# Patient Record
Sex: Male | Born: 1960
Health system: Southern US, Community
[De-identification: ages and names within clinical notes are randomized; demographics above are authoritative.]

## PROBLEM LIST (undated history)

## (undated) DIAGNOSIS — I10 Essential (primary) hypertension: Secondary | ICD-10-CM

## (undated) DIAGNOSIS — E119 Type 2 diabetes mellitus without complications: Secondary | ICD-10-CM

## (undated) DIAGNOSIS — N529 Male erectile dysfunction, unspecified: Secondary | ICD-10-CM

## (undated) DIAGNOSIS — K219 Gastro-esophageal reflux disease without esophagitis: Secondary | ICD-10-CM

## (undated) DIAGNOSIS — G4730 Sleep apnea, unspecified: Secondary | ICD-10-CM

## (undated) HISTORY — DX: Gastro-esophageal reflux disease without esophagitis: K21.9

## (undated) HISTORY — DX: Sleep apnea, unspecified: G47.30

## (undated) HISTORY — PX: OTHER SURGICAL HISTORY: SHX169

## (undated) HISTORY — DX: Male erectile dysfunction, unspecified: N52.9

## (undated) HISTORY — PX: CERVICAL SPINE SURGERY: SHX589

## (undated) HISTORY — PX: KNEE ARTHROSCOPY: SUR90

## (undated) HISTORY — DX: Type 2 diabetes mellitus without complications: E11.9

---

## 2003-01-25 ENCOUNTER — Ambulatory Visit (HOSPITAL_COMMUNITY): Admission: RE | Admit: 2003-01-25 | Discharge: 2003-01-25 | Payer: Self-pay | Admitting: Family Medicine

## 2003-01-25 ENCOUNTER — Encounter: Payer: Self-pay | Admitting: Family Medicine

## 2003-01-31 ENCOUNTER — Encounter: Payer: Self-pay | Admitting: Family Medicine

## 2003-01-31 ENCOUNTER — Ambulatory Visit (HOSPITAL_COMMUNITY): Admission: RE | Admit: 2003-01-31 | Discharge: 2003-01-31 | Payer: Self-pay | Admitting: Family Medicine

## 2003-03-17 ENCOUNTER — Ambulatory Visit (HOSPITAL_COMMUNITY): Admission: RE | Admit: 2003-03-17 | Discharge: 2003-03-18 | Payer: Self-pay | Admitting: Neurosurgery

## 2005-04-18 ENCOUNTER — Ambulatory Visit: Payer: Self-pay | Admitting: Internal Medicine

## 2005-04-29 ENCOUNTER — Ambulatory Visit: Payer: Self-pay | Admitting: Internal Medicine

## 2005-04-29 ENCOUNTER — Ambulatory Visit (HOSPITAL_COMMUNITY): Admission: RE | Admit: 2005-04-29 | Discharge: 2005-04-29 | Payer: Self-pay | Admitting: Internal Medicine

## 2005-04-30 ENCOUNTER — Ambulatory Visit: Admission: RE | Admit: 2005-04-30 | Discharge: 2005-04-30 | Payer: Self-pay | Admitting: Family Medicine

## 2005-05-10 ENCOUNTER — Ambulatory Visit: Payer: Self-pay | Admitting: Pulmonary Disease

## 2006-05-14 ENCOUNTER — Ambulatory Visit (HOSPITAL_COMMUNITY): Admission: RE | Admit: 2006-05-14 | Discharge: 2006-05-14 | Payer: Self-pay | Admitting: Family Medicine

## 2007-03-05 ENCOUNTER — Ambulatory Visit (HOSPITAL_COMMUNITY): Admission: RE | Admit: 2007-03-05 | Discharge: 2007-03-05 | Payer: Self-pay | Admitting: Neurosurgery

## 2007-03-16 ENCOUNTER — Observation Stay (HOSPITAL_COMMUNITY): Admission: RE | Admit: 2007-03-16 | Discharge: 2007-03-17 | Payer: Self-pay | Admitting: Neurosurgery

## 2010-06-03 ENCOUNTER — Encounter: Payer: Self-pay | Admitting: Family Medicine

## 2010-09-28 NOTE — Procedures (Signed)
[name], Chad [name]                 ACCOUNT NO.:  [id number]   MEDICAL RECORD NO.:  [id number]          PATIENT TYPE:  OUT   LOCATION:  SLEEP LAB                     FACILITY:  [facility]   PHYSICIAN:  Marcelyn Bruins, M.D. [facility] DATE OF BIRTH:  May 21, 1960   DATE OF STUDY:  04/30/2005                              NOCTURNAL POLYSOMNOGRAM   REFERRING PHYSICIAN:  Dr. Lilyan Punt.   DATE OF STUDY:  April 30, 2005.   INDICATION FOR STUDY:  Hypersomnia with sleep apnea.   EPWORTH SCORE:  17.   SLEEP ARCHITECTURE:  The patient total sleep time of 402 minutes with large  amounts of slow wave sleep and adequate REM. Sleep onset latency was normal  at 17 minutes and REM onset was mildly prolonged. Sleep efficiency was 93%.   RESPIRATORY DATA:  The patient underwent split night protocol where he was  found to have 173 obstructive events in the first 113 minutes of sleep. This  gave him a respiratory disturbance index of 92 events per hour extrapolated  over the entire study. There is very loud snoring noted, however, the events  were not positional. By protocol, the patient was placed on a medium  Respironics comfort select C-PAP mask and ultimately titrated to a final  pressure 11 cm with good control of his obstructive events.   OXYGEN DATA:  The patient had O2 desaturation as low as 80% associated with  his obstructive events.   CARDIAC DATA:  No clinically significant cardiac arrhythmias.   MOVEMENT/PARASOMNIA:  The patient had small numbers of leg jerks with no  significant sleep disruption.   IMPRESSION/RECOMMENDATIONS:  Very severe obstructive sleep apnea with a  respiratory disturbance index of 92 events per hour and O2 desaturation as  low as 80%. The patient was placed on C-PAP with a medium  Respironics comfort select nasal mask and titrated to a final pressure of 11  cm, with excellent control of his obstructive events.     ______________________________  Marcelyn Bruins, M.D. [facility]  Diplomate, American Board of Sleep  Medicine     KC/MEDQ  D:  05/10/2005 13:04:19  T:  05/11/2005 62:95:28  Job:  [id number]

## 2010-09-28 NOTE — Op Note (Signed)
NAMECHADLEY, Chad [name] NO.:  [id number]   MEDICAL RECORD NO.:  [id number]          PATIENT TYPE:  OBV   LOCATION:  3029                         FACILITY:  [facility]   PHYSICIAN:  Cristi Loron, M.D.DATE OF BIRTH:  08-06-1960   DATE OF PROCEDURE:  DATE OF DISCHARGE:  03/17/2007                               OPERATIVE REPORT   BRIEF HISTORY:  The patient is a 50 year old white male on whom I  performed a C5-C6 anterior cervical discectomy and fusion plating years  ago.  He did well until recently when he developed severe neck and right  arm pain consistent with a C7 radiculopathy.  He failed medical  management, was worked up with a cervical MRI, which demonstrated a  herniated disk at C6-C7 on the right.  I discussed various treatment  options with the patient including surgery.  The patient has weighed the  risks, benefits, and alternatives of surgery and decided to proceed with  a C6-C7 anterior cervical discectomy fusion and plating, as well as  removal of the old plate at E4-V4.   PREOPERATIVE DIAGNOSES:  C6-C7 herniated nucleus pulposus, spinal  stenosis, cervical radiculopathy, and cervicalgia.   POSTOPERATIVE DIAGNOSES:  C6-C7 herniated nucleus pulposus, spinal  stenosis, cervical radiculopathy, and cervicalgia.   PROCEDURE:  C6-C7 extensive anterior cervical discectomy/decompression;  C6-C7 anterior interbody local autograft arthrodesis; insertion of C6-C7  interbody prosthesis (Alphatec PEEK interbody prosthesis); C6, C7, C8  cervical plating using Slim-Loc titanium plate and screws; removal of  old Synthes plate at U9-W1.   SURGEON:  Cristi Loron, M.D.   ASSISTANT:  Coletta Memos, M.D.   ANESTHESIA:  General endotracheal.   ESTIMATED BLOOD LOSS:  100 mL.   SPECIMENS:  None.   DRAINS:  None.   COMPLICATIONS:  None.   DESCRIPTION OF PROCEDURE:  The patient was brought to the operating by  the anesthesia team.  General endotracheal  anesthesia was induced.  The  patient remained in the supine position.  A roll was placed under his  shoulders to place the neck in slight extension.  His anterior cervical  region was then shaved with clippers and prepared with Betadine scrub  and Betadine solution.  Sterile drapes were applied.  I then injected  the area to be incised with Marcaine with epinephrine solution and used  a scalpel to incise the patient's previous surgical scar on the left.  I  used the Metzenbaum scissors to divide the platysma muscle and then  dissected medial to sternocleidomastoid muscle, jugular vein, and  carotid artery.  We encountered expected of scar tissue from the prior  operation.  We carefully identified the esophagus and retracted it  medially and then used Kitner swabs to clear soft tissue from the  anterior cervical spine.  This exposed the plate at X9-J4.  We used the  scalpel and nerve hooks to clear soft tissue overlying the anterior  cervical plate at N8-G9 and then we removed the interlocking screw from  the old Synthes plate and then the screws and the plate.   We then used the electrocautery  to detach the medial border of the  longus colli muscle bilaterally at C6-C7.  We then inserted the Caspar  self-retaining retractor underneath his longus colli muscle bilaterally  to prior exposure.  We did not shoot an intraoperative radiograph, as we  obviously knew which level we were at because of the old plate screws at  C5-C6.  I incised the C6-C7 intervertebral disc and performed a partial  intervertebral discectomy with the pituitary forceps and the Carlen  curettes.  I then inserted the distraction screws at C6 and C7 to  distract the interspace and then used a high-speed drill to decorticate  the vertebral endplates and drilled away the remainder C6-C7  intervertebral disc to drill away some posterior spondylosis and to thin  out the posterior longitudinal ligament.  I then incised the  ligament  with arachnoid knife and removed it with the Kerrison punch undercutting  the vertebral endplates decompressing the thecal sac.  I then performed  foraminotomies about the bilateral C7 nerve root.  Of note, we  encountered expected large disc herniation C6-C7 on the right  compressing the right C7 nerve root.  At this point, we had a good  decompression.  We now turned attention to arthrodesis.   We used the trials spacers and determined to use a 7-mm medium Alphatec  interbody prosthesis.  We prefilled this prosthesis with combination of  local autograft bone we obtained during the decompression, as well as  VITOSS bone-graft extender.  We then inserted the prosthesis, distracted  C6-C7 interspace and removed the distraction screws.  There was a good  snug fit of the prosthesis in the interspace.  We then filled lateral to  the prosthesis with local autograft bone and VITOSS completing the  arthrodesis.   We now turned attention to anterior spinal instrumentation.  We used the  high-speed drill to remove some ventral spondylosis from the vertebral  endplates at C6-C7 so that the plate would lay down flat.  We selected  appropriate length Codman Slim-Loc anterior cervical plate and laid it  along the anterior aspect of vertebral bodies at C6 and C7.  We used the  old holes at C6 and secured the plate with 16-XW self-tapping screws.  We drilled two new 14-mm holes at C7 and then secured the plate at C7 by  placing two 14-mm self-tapping screws there as well.  We then obtained  intraoperative radiograph.  There was limited visualization of the  instrumentation because of the patient's body habitus but looked good in  vivo.  We therefore secured the screws and plate by locking each cam.   We then obtained hemostasis using bipolar cautery.  We irrigated the  wound out with bacitracin solution and removed the retractor.  We then  inspected the esophagus for any damage; there was  none apparent.  We  then reapproximated the patient's platysma muscle with interrupted 3-0  Vicryl suture, subcutaneous with interrupted 3-0 Vicryl suture, and the  skin with Steri-Strips and Benzoin.  The wound was then coated with  bacitracin ointment.  A sterile dressing was applied.  The drapes were  removed, and the patient was subsequently extubated by the anesthesia  team and transported to the post-anesthesia care unit in stable  condition.  All sponge, instrument, and needle counts were correct at  the end of this case.      Cristi Loron, M.D.  Electronically Signed     JDJ/MEDQ  D:  03/18/2007  T:  03/19/2007  Job:  [id number]

## 2010-09-28 NOTE — Op Note (Signed)
NAMEWENDLE, Chad [name]                           ACCOUNT NO.:  [id number]   MEDICAL RECORD NO.:  [id number]                   PATIENT TYPE:  OIB   LOCATION:  3001                                 FACILITY:  [facility]   PHYSICIAN:  Cristi Loron, M.D.            DATE OF BIRTH:  05/05/1961   DATE OF PROCEDURE:  03/17/2003  DATE OF DISCHARGE:  03/18/2003                                 OPERATIVE REPORT   INDICATIONS FOR PROCEDURE:  The patient is a 50 year old white male who has  suffered from neck  and left arm pain. He failed medical management  and was  worked up with a cervical MRI which demonstrated a herniated disk and  spondylosis and stenosis at C5-6. I discussed  the various treatment options  with the patient. The patient weighed the risks and benefits and  alternatives to surgery and decided to proceed with a C5-6 anterior cervical  diskectomy, fusion and plating.   PREOPERATIVE DIAGNOSIS:  C5-6 herniated nucleus pulposus, stenosis,  spondylosis, cervical radiculopathy, cervicalgia.   POSTOPERATIVE DIAGNOSIS:  C5-6 herniated nucleus pulposus, stenosis,  spondylosis, cervical radiculopathy, cervicalgia.   PROCEDURE:  C5-6 expansive anterior cervical diskectomy, decompression;  interbody iliac crest allograft arthrodesis; anterior cervical plating with  Synthes titanium plate and screws.   SURGEON:  Cristi Loron, M.D.   ASSISTANT:  Stefani Dama, M.D.   ANESTHESIA:  General endotracheal anesthesia.   ESTIMATED BLOOD LOSS:  50 mL.   SPECIMENS:  None.   DRAINS:  None.   COMPLICATIONS:  None.   DESCRIPTION OF PROCEDURE:  The patient was brought to the operating room by  the anesthesia team. General endotracheal anesthesia was induced. The  patient remained in the supine position. A roll was placed under the  shoulders to place his neck  in slight extension. His anterior cervical  region was then prepared with Betadine scrub and Betadine solution. Sterile  drapes were applied. I then injected the area to be incised with Marcaine  with epinephrine solution.   I used a scalpel to make a transverse incision in the patient's left  anterior neck. I used the Metzenbaum scissors to divide the platysma muscle  and then to dissect medial to the sternocleidomastoid, jugular vein and  carotid artery. I bluntly dissected down towards the anterior cervical  spine. I carefully  identified the esophagus  and retracted  it medially. I  used the Kidner swabs to clear soft tissue from the anterior cervical spine.  I then inserted a bent spinal needle into neck  to expose to interspace. We  then obtained the intraoperative radiograph to confirm our location.   We then used electrocautery to detach the medial border of the longus coli  muscle bilaterally  from the C5-6 interspace. We inserted the McCullough  retractor for exposure and  then incised the C5-6 intravertebral disk with a  #15 blade scalpel. We performed a  partial diskectomy using the pituitary  forceps and the Carlen curets. We inserted distractor screws at C5 and C6 to  distract the interspace, and used the high-speed drill to decorticate the  vertebral endplates of C5-6 as well as to drill away the remainder of the C5-  6 intravertebral disk, thin out the posterior longitudinal ligament and  drill away some posterior  spondylosis.   We then incised the thinned out ligament with the arachnoid knife and then  removed it with the Kerrison punch, undercutting the vertebral endplates,  decompressing the  thecal sac. We then performed a generous foraminotomy  about the bilateral C6 nerve roots decompressing them. We encountered a  combination of ruptured disk as well as bone spur, compressing the left  greater than right C6 nerve roots.   Having completed the decompression, we now turned our attention to the  arthrodesis. We obtained iliac crest tricortical allograft bone graft and  fashioned it to  its approximate dimensions, 7 mm in height and 1 cm in  depth. We inserted the bone graft in the distracted C5-6 interspace. We then  removed the distraction screws. There was a good structure of the bone  graft.   We now turned our attention to the instrumentation. We used the high-speed  drill to drill away some ventral spondylosis so that the plate would lay  down flat. We selected the appropriate sized Synthes anterior cervical  plate. We laid it along the anterior aspect of the vertebral bodies from C5  to C6. We drilled 2 holes at C5 and 2 at C6, tapped the holes  and secured  the  plate to the vertebral body, placing two 14-mm screws at C5 and 6. We  then obtained an intraoperative radiograph which demonstrated good position  of the plate, screws and my graft. We secured the screws to the plate by  placing a locking screw at each screw.   We then obtained stringent hemostasis using bipolar electrocautery. We  copiously irrigated the wound out with Bacitracin solution. We removed the  solution. We then removed  the Caspar self-retaining retractor. We inspected  the esophagus  for any damage; there  was none apparent. We then  reapproximated the patient's platysma muscle with interrupted 3-0 Vicryl  suture, the subcutaneous tissue with interrupted 3-0 Vicryl suture and the  skin with Steri-Strips and Benzoin. The wound was then coated with  Bacitracin ointment. A sterile dressing was applied.   The drapes were removed. The patient was subsequently extubated by the  anesthesia team and transported to the post anesthesia care unit in stable  condition. All sponge, instrument and needle counts were correct at the end  of the case.                                               Cristi Loron, M.D.    JDJ/MEDQ  D:  03/17/2003  T:  03/18/2003  Job:  [id number]

## 2010-09-28 NOTE — Op Note (Signed)
Chad [name], ABAIR                 ACCOUNT NO.:  [id number]   MEDICAL RECORD NO.:  [id number]          PATIENT TYPE:  AMB   LOCATION:  DAY                           FACILITY:  [facility]   PHYSICIAN:  Lionel December, M.D.    DATE OF BIRTH:  March 21, 1961   DATE OF PROCEDURE:  04/29/2005  DATE OF DISCHARGE:                                 OPERATIVE REPORT   PROCEDURE:  Colonoscopy.   INDICATIONS:  Derrius is a 50 year old Caucasian male who developed diarrhea  and abdominal cramps about two weeks ago. He has improved, but his bowels  have never returned to normal. He was noted to have heme-positive stools by  Dr. Gerda Diss. I saw him two weeks ago and begun on dicyclomine, and he does  not feel any better. He is undergoing colonoscopy to make sure he does not  have low grade colitis. Procedure and risks were reviewed with the patient,  and informed consent was obtained.   MEDICINE FOR CONSCIOUS SEDATION:  Demerol 50 mg IV, Versed 5 mg IV.   FINDINGS:  Procedure performed in endoscopy suite. The patient's vital signs  and O2 saturation were monitored during the procedure and remained stable.  The patient was placed in left lateral position and rectal examination  performed. No abnormality noted on external or digital exam. Olympus  videoscope was placed in rectum and advanced under vision into sigmoid colon  and beyond. Preparation was satisfactory. There were a few diverticula at  proximal sigmoid colon. There was small polyp at proximal and a small polyp  at transverse colon which was ablated via cold biopsy. Scope was passed into  cecum which was identified by appendiceal orifice and ileocecal valve.  Pictures taken for the record. As the scope was withdrawn, colonic mucosa  was carefully examined and was normal throughout. Rectal mucosa similarly  was normal. Scope was retroflexed to examine anorectal junction which was  unremarkable. Endoscope was straightened and withdrawn. The patient  tolerated the procedure well.   FINAL DIAGNOSIS:  1.  No endoscopic evidence of colitis.  2.  Few small diverticula at sigmoid colon.  3.  A 3-mm polyp ablated via cold biopsy from transverse colon.   RECOMMENDATIONS:  Suspect he has post infectious diarrhea or residual  symptoms. I feel his bowel should return to normal in the future.   RECOMMENDATIONS:  I will be contacting the patient with biopsy results. I  would like for him to continue dicyclomine, but he can try 10 mg 3 times a  day.      Lionel December, M.D.  Electronically Signed     NR/MEDQ  D:  04/29/2005  T:  04/30/2005  Job:  [id number]   cc:   Lorin Picket A. Gerda Diss, MD  Fax: [phone]

## 2011-02-19 LAB — CBC
HCT: 41
Hemoglobin: 14.2
MCHC: 34.7
MCV: 89.3
Platelets: 213
RBC: 4.59
RDW: 13.2
WBC: 10.2

## 2013-06-30 ENCOUNTER — Telehealth: Payer: Self-pay | Admitting: *Deleted

## 2013-07-02 NOTE — Telephone Encounter (Signed)
Order faxed to [facility]  

## 2013-08-05 ENCOUNTER — Encounter: Payer: Self-pay | Admitting: Family Medicine

## 2013-08-05 ENCOUNTER — Ambulatory Visit (INDEPENDENT_AMBULATORY_CARE_PROVIDER_SITE_OTHER): Payer: 59 | Admitting: Family Medicine

## 2013-08-05 VITALS — BP 120/78 | HR 78 | Temp 98.0°F | Resp 18 | Ht 70.0 in | Wt 253.0 lb

## 2013-08-05 DIAGNOSIS — Z0000 Encounter for general adult medical examination without abnormal findings: Secondary | ICD-10-CM

## 2013-08-05 DIAGNOSIS — G4733 Obstructive sleep apnea (adult) (pediatric): Secondary | ICD-10-CM

## 2013-08-05 DIAGNOSIS — G4730 Sleep apnea, unspecified: Secondary | ICD-10-CM | POA: Insufficient documentation

## 2013-08-05 MED ORDER — SILDENAFIL CITRATE 100 MG PO TABS: 50.0000 mg | ORAL_TABLET | Freq: Every day | ORAL | Status: DC | PRN

## 2013-08-05 NOTE — Progress Notes (Signed)
   Subjective:    Patient ID: Chad [name], male    DOB: 18-Oct-1960, 53 y.o.   MRN: [id number]  HPI  Patient has a history of obstructive sleep apnea. This was diagnosed in 2006. At that time he had an Epworth sleep score of 17. The sleep study revealed very severe obstructive sleep apnea with a respiratory disturbance index of 92 events per hour. He was titrated to 11 cm of water pressure using CPAP. Prior to using CPAP, the patient reported hypersomnolence, excessive daytime sleepiness, fatigability. Since wearing the CPAP he has no issues with any of these symptoms. He feels great wearing his CPAP. He has been using it consistently for the last 9 years. He is requesting a new machine as his old machine is starting to give him mechanical problems.  He wears the machine approximatly 7 hours every night and is compliant with therapy.  He is also due for a colonoscopy. He has a history of colon polyps. Past Medical History  Diagnosis Date  . GERD (gastroesophageal reflux disease)   . Erectile dysfunction   . Sleep apnea    No current outpatient prescriptions on file prior to visit.   No current facility-administered medications on file prior to visit.   No Known Allergies History   Social History  . Marital Status: Married    Spouse Name: N/A    Number of Children: N/A  . Years of Education: N/A   Occupational History  . Not on file.   Social History Main Topics  . Smoking status: Never Smoker   . Smokeless tobacco: Not on file  . Alcohol Use: Yes  . Drug Use: No  . Sexual Activity: Not on file   Other Topics Concern  . Not on file   Social History Narrative  . No narrative on file     Review of Systems  All other systems reviewed and are negative.       Objective:   Physical Exam  Vitals reviewed. Neck: Neck supple. No JVD present. No thyromegaly present.  Cardiovascular: Normal rate, regular rhythm and normal heart sounds.   No murmur heard. Pulmonary/Chest:  Effort normal and breath sounds normal. No respiratory distress. He has no wheezes. He has no rales.  Abdominal: Soft. Bowel sounds are normal. He exhibits no distension. There is no tenderness. There is no rebound and no guarding.  Musculoskeletal: He exhibits no edema.  Lymphadenopathy:    He has no cervical adenopathy.          Assessment & Plan:  1. Obstructive sleep apnea I am ordering a new CPAP machine and our fax that information along to his office visit over to [facility] pharmacy [facility].  2. Routine general medical examination at a health care facility Recommended the patient return fasting for a CBC, CMP, fasting lipid panel, and a PSA. I recommended an annual physical exam once a year. Also schedule the patient to see his gastroenterologist for a colonoscopy - CBC with Differential; Future - COMPLETE METABOLIC PANEL WITH GFR; Future - Lipid panel; Future - PSA; Future

## 2013-08-09 ENCOUNTER — Encounter (INDEPENDENT_AMBULATORY_CARE_PROVIDER_SITE_OTHER): Payer: Self-pay | Admitting: *Deleted

## 2013-08-11 ENCOUNTER — Other Ambulatory Visit: Payer: 59

## 2013-08-11 LAB — LIPID PANEL
Cholesterol: 133 mg/dL (ref 0–200)
HDL: 35 mg/dL — ABNORMAL LOW (ref >39)
LDL Cholesterol: 70 mg/dL (ref 0–99)
Total CHOL/HDL Ratio: 3.8 Ratio
Triglycerides: 141 mg/dL (ref <150)
VLDL: 28 mg/dL (ref 0–40)

## 2013-08-11 LAB — COMPLETE METABOLIC PANEL WITH GFR
ALT: 23 U/L (ref 0–53)
AST: 23 U/L (ref 0–37)
Albumin: 4.2 g/dL (ref 3.5–5.2)
Alkaline Phosphatase: 56 U/L (ref 39–117)
BUN: 12 mg/dL (ref 6–23)
CO2: 27 mEq/L (ref 19–32)
Calcium: 9 mg/dL (ref 8.4–10.5)
Chloride: 106 mEq/L (ref 96–112)
Creat: 0.96 mg/dL (ref 0.50–1.35)
GFR, Est African American: >89 mL/min
GFR, Est Non African American: >89 mL/min
Glucose, Bld: 85 mg/dL (ref 70–99)
Potassium: 4.3 mEq/L (ref 3.5–5.3)
Sodium: 141 mEq/L (ref 135–145)
Total Bilirubin: 0.6 mg/dL (ref 0.2–1.2)
Total Protein: 6.7 g/dL (ref 6.0–8.3)

## 2013-08-11 LAB — CBC WITH DIFFERENTIAL/PLATELET
Basophils Absolute: 0.1 10*3/uL (ref 0.0–0.1)
Basophils Relative: 1 % (ref 0–1)
Eosinophils Absolute: 0.1 10*3/uL (ref 0.0–0.7)
Eosinophils Relative: 2 % (ref 0–5)
HCT: 41.7 % (ref 39.0–52.0)
Hemoglobin: 14.1 g/dL (ref 13.0–17.0)
Lymphocytes Relative: 29 % (ref 12–46)
Lymphs Abs: 2 10*3/uL (ref 0.7–4.0)
MCH: 30.8 pg (ref 26.0–34.0)
MCHC: 33.8 g/dL (ref 30.0–36.0)
MCV: 91 fL (ref 78.0–100.0)
Monocytes Absolute: 0.5 10*3/uL (ref 0.1–1.0)
Monocytes Relative: 7 % (ref 3–12)
Neutro Abs: 4.3 10*3/uL (ref 1.7–7.7)
Neutrophils Relative %: 61 % (ref 43–77)
Platelets: 194 10*3/uL (ref 150–400)
RBC: 4.58 MIL/uL (ref 4.22–5.81)
RDW: 13.6 % (ref 11.5–15.5)
WBC: 7 10*3/uL (ref 4.0–10.5)

## 2013-08-11 NOTE — Addendum Note (Signed)
Addended by: Reginia FortsATKINS, Daivon Rayos on: 08/11/2013 08:06 AM   Modules accepted: Orders

## 2013-08-12 ENCOUNTER — Encounter: Payer: Self-pay | Admitting: *Deleted

## 2013-08-12 LAB — PSA: PSA: 0.91 ng/mL (ref <=4.00)

## 2013-08-18 ENCOUNTER — Telehealth (INDEPENDENT_AMBULATORY_CARE_PROVIDER_SITE_OTHER): Payer: Self-pay | Admitting: *Deleted

## 2013-08-18 ENCOUNTER — Other Ambulatory Visit (INDEPENDENT_AMBULATORY_CARE_PROVIDER_SITE_OTHER): Payer: Self-pay | Admitting: *Deleted

## 2013-08-18 ENCOUNTER — Encounter (INDEPENDENT_AMBULATORY_CARE_PROVIDER_SITE_OTHER): Payer: Self-pay | Admitting: *Deleted

## 2013-08-18 DIAGNOSIS — Z1211 Encounter for screening for malignant neoplasm of colon: Secondary | ICD-10-CM

## 2013-08-18 NOTE — Telephone Encounter (Signed)
Patient needs movi prep 

## 2013-08-19 MED ORDER — PEG-KCL-NACL-NASULF-NA ASC-C 100 G PO SOLR: 1.0000 | Freq: Once | ORAL | Status: DC

## 2013-09-27 ENCOUNTER — Encounter: Payer: Self-pay | Admitting: Family Medicine

## 2013-10-29 ENCOUNTER — Telehealth (INDEPENDENT_AMBULATORY_CARE_PROVIDER_SITE_OTHER): Payer: Self-pay | Admitting: *Deleted

## 2013-10-29 NOTE — Telephone Encounter (Signed)
  Procedure: tcs  Reason/Indication:  screening  Has patient had this procedure before?  Yes, 10 years ago  If so, when, by whom and where?    Is there a family history of colon cancer?  no  Who?  What age when diagnosed?    Is patient diabetic?   no      Does patient have prosthetic heart valve?  no  Do you have a pacemaker?  no  Has patient ever had endocarditis? no  Has patient had joint replacement within last 12 months?  no  Does patient tend to be constipated or take laxatives? no  Is patient on Coumadin, Plavix and/or Aspirin? no  Medications: prilosec 20 mg daily  Allergies: nkda  Medication Adjustment:   Procedure date & time: 11/25/13 at 730

## 2013-11-01 NOTE — Telephone Encounter (Signed)
agree

## 2013-11-18 ENCOUNTER — Encounter (HOSPITAL_COMMUNITY): Payer: Self-pay | Admitting: Pharmacy Technician

## 2013-12-09 ENCOUNTER — Other Ambulatory Visit (INDEPENDENT_AMBULATORY_CARE_PROVIDER_SITE_OTHER): Payer: Self-pay | Admitting: *Deleted

## 2013-12-09 ENCOUNTER — Encounter (INDEPENDENT_AMBULATORY_CARE_PROVIDER_SITE_OTHER): Payer: Self-pay | Admitting: *Deleted

## 2013-12-09 DIAGNOSIS — Z1211 Encounter for screening for malignant neoplasm of colon: Secondary | ICD-10-CM

## 2014-01-10 ENCOUNTER — Telehealth (INDEPENDENT_AMBULATORY_CARE_PROVIDER_SITE_OTHER): Payer: Self-pay | Admitting: *Deleted

## 2014-01-10 NOTE — Telephone Encounter (Signed)
  Procedure: tcs  Reason/Indication:  screening  Has patient had this procedure before?  Yes, 10 years ago  If so, when, by whom and where?    Is there a family history of colon cancer?  no  Who?  What age when diagnosed?    Is patient diabetic?   no      Does patient have prosthetic heart valve?  no  Do you have a pacemaker?  no  Has patient ever had endocarditis? no  Has patient had joint replacement within last 12 months?  no  Does patient tend to be constipated or take laxatives? no  Is patient on Coumadin, Plavix and/or Aspirin? no  Medications: prilosec 20 mg daily,  Allergies: nkda  Medication Adjustment:   Procedure date & time: 02/09/14 at 12

## 2014-01-10 NOTE — Telephone Encounter (Signed)
agree

## 2014-02-09 ENCOUNTER — Ambulatory Visit (HOSPITAL_COMMUNITY)
Admission: RE | Admit: 2014-02-09 | Discharge: 2014-02-09 | Disposition: A | Discharge status: Home Health | Payer: 59 | Source: Ambulatory Visit | Attending: Internal Medicine | Admitting: Internal Medicine

## 2014-02-09 ENCOUNTER — Encounter (HOSPITAL_COMMUNITY)
Admission: RE | Disposition: A | Discharge status: Home Health | Payer: Self-pay | Source: Ambulatory Visit | Attending: Internal Medicine

## 2014-02-09 ENCOUNTER — Encounter (HOSPITAL_COMMUNITY): Payer: Self-pay | Admitting: *Deleted

## 2014-02-09 DIAGNOSIS — Z79899 Other long term (current) drug therapy: Secondary | ICD-10-CM | POA: Insufficient documentation

## 2014-02-09 DIAGNOSIS — K5289 Other specified noninfective gastroenteritis and colitis: Secondary | ICD-10-CM | POA: Insufficient documentation

## 2014-02-09 DIAGNOSIS — K648 Other hemorrhoids: Secondary | ICD-10-CM | POA: Insufficient documentation

## 2014-02-09 DIAGNOSIS — K633 Ulcer of intestine: Secondary | ICD-10-CM

## 2014-02-09 DIAGNOSIS — Z1211 Encounter for screening for malignant neoplasm of colon: Secondary | ICD-10-CM

## 2014-02-09 DIAGNOSIS — K644 Residual hemorrhoidal skin tags: Secondary | ICD-10-CM | POA: Insufficient documentation

## 2014-02-09 DIAGNOSIS — G4730 Sleep apnea, unspecified: Secondary | ICD-10-CM | POA: Insufficient documentation

## 2014-02-09 DIAGNOSIS — K5730 Diverticulosis of large intestine without perforation or abscess without bleeding: Secondary | ICD-10-CM

## 2014-02-09 DIAGNOSIS — K219 Gastro-esophageal reflux disease without esophagitis: Secondary | ICD-10-CM | POA: Insufficient documentation

## 2014-02-09 DIAGNOSIS — N529 Male erectile dysfunction, unspecified: Secondary | ICD-10-CM | POA: Insufficient documentation

## 2014-02-09 HISTORY — PX: COLONOSCOPY: SHX5424

## 2014-02-09 SURGERY — COLONOSCOPY
Anesthesia: Moderate Sedation

## 2014-02-09 MED ORDER — MEPERIDINE HCL 50 MG/ML IJ SOLN
INTRAMUSCULAR | Status: DC | PRN
  Administered 2014-02-09 (×2): 25 mg via INTRAVENOUS

## 2014-02-09 MED ORDER — MEPERIDINE HCL 50 MG/ML IJ SOLN
INTRAMUSCULAR | Status: AC
  Filled 2014-02-09: qty 1

## 2014-02-09 MED ORDER — MIDAZOLAM HCL 5 MG/5ML IJ SOLN
INTRAMUSCULAR | Status: DC | PRN
  Administered 2014-02-09 (×3): 2 mg via INTRAVENOUS

## 2014-02-09 MED ORDER — SODIUM CHLORIDE 0.9 % IV SOLN
INTRAVENOUS | Status: DC
  Administered 2014-02-09: 09:00:00 via INTRAVENOUS

## 2014-02-09 MED ORDER — MIDAZOLAM HCL 5 MG/5ML IJ SOLN
INTRAMUSCULAR | Status: AC
  Filled 2014-02-09: qty 10

## 2014-02-09 NOTE — Op Note (Signed)
[facility] [address] [place], [id number]   COLONOSCOPY PROCEDURE REPORT     EXAM DATE: 02/09/2014  PATIENT NAME:      Chad [name], [name]           MR #:      [id number]  BIRTHDATE:       25-Jun-1960      VISIT #:     [id number]  ATTENDING:     Lionel DecemberNajeeb Keayra Graham, MD     STATUS:     outpatient REFERRING MD:      Lynnea FerrierWarren Pickard, M.D. ASA CLASS:        Class I  INDICATIONS:  The patient is a 53 yr old male here for a colonoscopy due to average risk for colon cancer. PROCEDURE PERFORMED:     Colonoscopy, screening MEDICATIONS:     Demerol 50 mg IV and Versed 6 mg IV ESTIMATED BLOOD LOSS:     None  CONSENT: The patient understands the risks and benefits of the procedure and understands that these risks include, but are not limited to: sedation, allergic reaction, infection, perforation and/or bleeding. Alternative means of evaluation and treatment include, among others: physical exam, x-rays, and/or surgical intervention. The patient elects to proceed with this endoscopic procedure.  DESCRIPTION OF PROCEDURE: During intra-op preparation period all mechanical & medical equipment was checked for proper function. Hand hygiene and appropriate measures for infection prevention was taken. After the risks, benefits and alternatives of the procedure were thoroughly explained, Informed consent was verified, confirmed and timeout was successfully executed by the treatment team. A digital exam revealed no abnormalities of the rectum.      The [id number] ([id number] endoscope was introduced through the anus and advanced to the cecum, which was identified by both the appendix and ileocecal valve. The prep was excellent.. The instrument was then slowly withdrawn as the colon was fully examined.   COLON FINDINGS: Few diverticula at sigmoid colon.   Small ulcer noted at the distal sigmoid colon.  Biopsy taken for routine histology.   Small external and internal hemorrhoids  were found. Retroflexed views revealed no abnormalities.  The scope was then completely withdrawn from the patient and the procedure terminated.  WITHDRAWAL TIME: 17 minutes 0 seconds    ADVERSE EVENTS:      There were no immediate complications.  IMPRESSIONS:     1.  Few diverticula at sigmoid colon 2.  Small ulcer noted at the distal sigmoid colon.  Biopsy taken for routine histology 3.  Small external and internal hemorrhoids  RECOMMENDATIONS:     Await biopsy results RECALL:     Return in 10 years for Colonoscopy.  Lionel DecemberNajeeb Mamadou Breon, MD eSigned:  Lionel DecemberNajeeb Betzabe Bevans, MD 02/09/2014 9:56 AM   cc:  CPT CODES: ICD CODES:  The ICD and CPT codes recommended by this software are interpretations from the data that the clinical staff has captured with the software.  The verification of the translation of this report to the ICD and CPT codes and modifiers is the sole responsibility of the health care institution and practicing physician where this report was generated.  [employer] will not be held responsible for the validity of the ICD and CPT codes included on this report.  AMA assumes no liability for data contained or not contained herein. CPT is a Publishing rights managerregistered trademark of the [employer].

## 2014-02-09 NOTE — H&P (Signed)
Chad [name] is an 53 y.o. male.   Chief Complaint: Patient is here for colonoscopy. HPI: Patient is 53 year old Caucasian male who is in for screening colonoscopy. He denies abdominal pain change in bowel habits or rectal bleeding. His last colonoscopy was diagnostic colonoscopy 9 years ago and had small polyp removed with possible hyperplastic and was advised to return in 10 years.  Past Medical History  Diagnosis Date  . GERD (gastroesophageal reflux disease)   . Erectile dysfunction   . Sleep apnea     Past Surgical History  Procedure Laterality Date  . Left knee arthroscopy    . Cervical spine surgery  2002, 2004    Family History  Problem Relation Age of Onset  . Colon cancer Neg Hx    Social History:  reports that he has never smoked. He does not have any smokeless tobacco history on file. He reports that he drinks alcohol. He reports that he does not use illicit drugs.  Allergies: No Known Allergies  Medications Prior to Admission  Medication Sig Dispense Refill  . peg 3350 powder (MOVIPREP) 100 G SOLR Take 1 kit (200 g total) by mouth once.  1 kit  0  . omeprazole (PRILOSEC) 20 MG capsule Take 20 mg by mouth daily.      . sildenafil (VIAGRA) 100 MG tablet Take 0.5-1 tablets (50-100 mg total) by mouth daily as needed for erectile dysfunction.  5 tablet  11    No results found for this or any previous visit (from the past 48 hour(s)). No results found.  ROS  Blood pressure 126/87, pulse 59, temperature 98.5 F (36.9 C), temperature source Oral, resp. rate 18, height $RemoveBe'5\' 10"'dpoolBtdr$  (1.778 m), weight 240 lb (108.863 kg), SpO2 96.00%. Physical Exam  Constitutional: He appears well-developed and well-nourished.  HENT:  Mouth/Throat: Oropharynx is clear and moist.  Eyes: Conjunctivae are normal. No scleral icterus.  Neck: No thyromegaly present.  Cardiovascular: Normal rate, regular rhythm and normal heart sounds.   No murmur heard. Respiratory: Effort normal and breath  sounds normal.  GI: Soft. He exhibits no distension and no mass. There is no tenderness.  Musculoskeletal: He exhibits no edema.  Lymphadenopathy:    He has no cervical adenopathy.  Neurological: He is alert.  Skin: Skin is warm and dry.     Assessment/Plan Average risk screening colonoscopy.  REHMAN,NAJEEB U 02/09/2014, 9:11 AM

## 2014-02-09 NOTE — Discharge Instructions (Signed)
Resume usual medications and high fiber diet. No driving for 24 hours. Physician will call with biopsy results  Colonoscopy, Care After Refer to this sheet in the next few weeks. These instructions provide you with information on caring for yourself after your procedure. Your health care provider may also give you more specific instructions. Your treatment has been planned according to current medical practices, but problems sometimes occur. Call your health care provider if you have any problems or questions after your procedure. WHAT TO EXPECT AFTER THE PROCEDURE  After your procedure, it is typical to have the following:  A small amount of blood in your stool.  Moderate amounts of gas and mild abdominal cramping or bloating. HOME CARE INSTRUCTIONS  Do not drive, operate machinery, or sign important documents for 24 hours.  You may shower and resume your regular physical activities, but move at a slower pace for the first 24 hours.  Take frequent rest periods for the first 24 hours.  Walk around or put a warm pack on your abdomen to help reduce abdominal cramping and bloating.  Drink enough fluids to keep your urine clear or pale yellow.  You may resume your normal diet as instructed by your health care provider. Avoid heavy or fried foods that are hard to digest.  Avoid drinking alcohol for 24 hours or as instructed by your health care provider.  Only take over-the-counter or prescription medicines as directed by your health care provider.  If a tissue sample (biopsy) was taken during your procedure:  Do not take aspirin or blood thinners for 7 days, or as instructed by your health care provider.  Do not drink alcohol for 7 days, or as instructed by your health care provider.  Eat soft foods for the first 24 hours. SEEK MEDICAL CARE IF: You have persistent spotting of blood in your stool 2-3 days after the procedure. SEEK IMMEDIATE MEDICAL CARE IF:  You have more than a  small spotting of blood in your stool.  You pass large blood clots in your stool.  Your abdomen is swollen (distended).  You have nausea or vomiting.  You have a fever.  You have increasing abdominal pain that is not relieved with medicine.  High-Fiber Diet Fiber is found in fruits, vegetables, and grains. A high-fiber diet encourages the addition of more whole grains, legumes, fruits, and vegetables in your diet. The recommended amount of fiber for adult males is 38 g per day. For adult females, it is 25 g per day. Pregnant and lactating women should get 28 g of fiber per day. If you have a digestive or bowel problem, ask your caregiver for advice before adding high-fiber foods to your diet. Eat a variety of high-fiber foods instead of only a select few type of foods.  PURPOSE  To increase stool bulk.  To make bowel movements more regular to prevent constipation.  To lower cholesterol.  To prevent overeating. WHEN IS THIS DIET USED?  It may be used if you have constipation and hemorrhoids.  It may be used if you have uncomplicated diverticulosis (intestine condition) and irritable bowel syndrome.  It may be used if you need help with weight management.  It may be used if you want to add it to your diet as a protective measure against atherosclerosis, diabetes, and cancer. SOURCES OF FIBER  Whole-grain breads and cereals.  Fruits, such as apples, oranges, bananas, berries, prunes, and pears.  Vegetables, such as green peas, carrots, sweet potatoes, beets, broccoli,  cabbage, spinach, and artichokes.  Legumes, such split peas, soy, lentils.  Almonds. FIBER CONTENT IN FOODS Starches and Grains / Dietary Fiber (g)  Cheerios, 1 cup / 3 g  Corn Flakes cereal, 1 cup / 0.7 g  Rice crispy treat cereal, 1 cup / 0.3 g  Instant oatmeal (cooked),  cup / 2 g  Frosted wheat cereal, 1 cup / 5.1 g  Brown, long-grain rice (cooked), 1 cup / 3.5 g  White, long-grain rice  (cooked), 1 cup / 0.6 g  Enriched macaroni (cooked), 1 cup / 2.5 g Legumes / Dietary Fiber (g)  Baked beans (canned, plain, or vegetarian),  cup / 5.2 g  Kidney beans (canned),  cup / 6.8 g  Pinto beans (cooked),  cup / 5.5 g Breads and Crackers / Dietary Fiber (g)  Plain or honey graham crackers, 2 squares / 0.7 g  Saltine crackers, 3 squares / 0.3 g  Plain, salted pretzels, 10 pieces / 1.8 g  Whole-wheat bread, 1 slice / 1.9 g  White bread, 1 slice / 0.7 g  Raisin bread, 1 slice / 1.2 g  Plain bagel, 3 oz / 2 g  Flour tortilla, 1 oz / 0.9 g  Corn tortilla, 1 small / 1.5 g  Hamburger or hotdog bun, 1 small / 0.9 g Fruits / Dietary Fiber (g)  Apple with skin, 1 medium / 4.4 g  Sweetened applesauce,  cup / 1.5 g  Banana,  medium / 1.5 g  Grapes, 10 grapes / 0.4 g  Orange, 1 small / 2.3 g  Raisin, 1.5 oz / 1.6 g  Melon, 1 cup / 1.4 g Vegetables / Dietary Fiber (g)  Green beans (canned),  cup / 1.3 g  Carrots (cooked),  cup / 2.3 g  Broccoli (cooked),  cup / 2.8 g  Peas (cooked),  cup / 4.4 g  Mashed potatoes,  cup / 1.6 g  Lettuce, 1 cup / 0.5 g  Corn (canned),  cup / 1.6 g  Tomato,  cup / 1.1 g Colon Polyps Polyps are lumps of extra tissue growing inside the body. Polyps can grow in the large intestine (colon). Most colon polyps are noncancerous (benign). However, some colon polyps can become cancerous over time. Polyps that are larger than a pea may be harmful. To be safe, caregivers remove and test all polyps. CAUSES  Polyps form when mutations in the genes cause your cells to grow and divide even though no more tissue is needed. RISK FACTORS There are a number of risk factors that can increase your chances of getting colon polyps. They include:  Being older than 50 years.  Family history of colon polyps or colon cancer.  Long-term colon diseases, such as colitis or Crohn disease.  Being overweight.  Smoking.  Being  inactive.  Drinking too much alcohol. SYMPTOMS  Most small polyps do not cause symptoms. If symptoms are present, they may include:  Blood in the stool. The stool may look dark red or black.  Constipation or diarrhea that lasts longer than 1 week. DIAGNOSIS People often do not know they have polyps until their caregiver finds them during a regular checkup. Your caregiver can use 4 tests to check for polyps:  Digital rectal exam. The caregiver wears gloves and feels inside the rectum. This test would find polyps only in the rectum.  Barium enema. The caregiver puts a liquid called barium into your rectum before taking X-rays of your colon. Barium makes your colon look  white. Polyps are dark, so they are easy to see in the X-ray pictures.  Sigmoidoscopy. A thin, flexible tube (sigmoidoscope) is placed into your rectum. The sigmoidoscope has a light and tiny camera in it. The caregiver uses the sigmoidoscope to look at the last third of your colon.  Colonoscopy. This test is like sigmoidoscopy, but the caregiver looks at the entire colon. This is the most common method for finding and removing polyps. TREATMENT  Any polyps will be removed during a sigmoidoscopy or colonoscopy. The polyps are then tested for cancer. PREVENTION  To help lower your risk of getting more colon polyps:  Eat plenty of fruits and vegetables. Avoid eating fatty foods.  Do not smoke.  Avoid drinking alcohol.  Exercise every day.  Lose weight if recommended by your caregiver.  Eat plenty of calcium and folate. Foods that are rich in calcium include milk, cheese, and broccoli. Foods that are rich in folate include chickpeas, kidney beans, and spinach. HOME CARE INSTRUCTIONS Keep all follow-up appointments as directed by your caregiver. You may need periodic exams to check for polyps. SEEK MEDICAL CARE IF: You notice bleeding during a bowel movement. Document Released: 01/24/2004 Document Revised: 07/22/2011  Document Reviewed: 07/09/2011 [facility] Patient Information 2015 [place], [place]. This information is not intended to replace advice given to you by your health care provider. Make sure you discuss any questions you have with your health care provider.

## 2014-02-11 ENCOUNTER — Encounter (HOSPITAL_COMMUNITY): Payer: Self-pay | Admitting: Internal Medicine

## 2014-07-15 ENCOUNTER — Telehealth: Payer: Self-pay | Admitting: Family Medicine

## 2014-07-15 NOTE — Telephone Encounter (Signed)
PT spoke to sandy and she recommended to got to urgent care and pt agreed.

## 2014-07-15 NOTE — Telephone Encounter (Signed)
Having rectal pain all week.  Notice hard knot at rectum also.  Has been using OTC Prep H.  Today knot has ruptured and is bleeding.  Can't seem to get it to stop.  Has been bleeding since 7AM.  Not heavy but enough were he need to hold tissue paper there to keep off clothing.

## 2015-12-22 ENCOUNTER — Ambulatory Visit (INDEPENDENT_AMBULATORY_CARE_PROVIDER_SITE_OTHER): Payer: 59 | Admitting: Family Medicine

## 2015-12-22 VITALS — BP 146/100 | HR 96 | Temp 98.1°F | Resp 18 | Ht 70.0 in | Wt 269.0 lb

## 2015-12-22 DIAGNOSIS — W57XXXA Bitten or stung by nonvenomous insect and other nonvenomous arthropods, initial encounter: Secondary | ICD-10-CM

## 2015-12-22 DIAGNOSIS — T148 Other injury of unspecified body region: Secondary | ICD-10-CM | POA: Diagnosis not present

## 2015-12-22 MED ORDER — CLOBETASOL PROPIONATE 0.05 % EX CREA: 1.0000 "application " | TOPICAL_CREAM | Freq: Two times a day (BID) | CUTANEOUS | 0 refills | Status: DC

## 2015-12-22 NOTE — Progress Notes (Signed)
   Subjective:    Patient ID: Chad [name], male    DOB: 1960/06/07, 55 y.o.   MRN: [id number]  HPI Patient has numerous insect bites on his left leg. There are approximately 10. They start around the level of his calf and go to his medial left thigh. Each is approximately 4 mm in diameter erythematous with surrounding edema. He also has 2 bites on his right tricep and one bite on his left tricep. He has one bite on his lower right flank. They're very itchy. His blood pressure today coincidentally is very high Past Medical History:  Diagnosis Date  . Erectile dysfunction   . GERD (gastroesophageal reflux disease)   . Sleep apnea    Past Surgical History:  Procedure Laterality Date  . CERVICAL SPINE SURGERY  2002, 2004  . COLONOSCOPY N/A 02/09/2014   Procedure: COLONOSCOPY;  Surgeon: Malissa HippoNajeeb U Rehman, MD;  Location: [facility];  Service: Endoscopy;  Laterality: N/A;  730 - rescheduled to 930 - Ann notified pt  . Left knee arthroscopy     Current Outpatient Prescriptions on File Prior to Visit  Medication Sig Dispense Refill  . omeprazole (PRILOSEC) 20 MG capsule Take 20 mg by mouth daily.    . sildenafil (VIAGRA) 100 MG tablet Take 0.5-1 tablets (50-100 mg total) by mouth daily as needed for erectile dysfunction. 5 tablet 11   No current facility-administered medications on file prior to visit.    No Known Allergies Social History   Social History  . Marital status: Divorced    Spouse name: N/A  . Number of children: N/A  . Years of education: N/A   Occupational History  . Not on file.   Social History Main Topics  . Smoking status: Never Smoker  . Smokeless tobacco: Not on file  . Alcohol use Yes     Comment: Once a month  . Drug use: No  . Sexual activity: Not on file   Other Topics Concern  . Not on file   Social History Narrative  . No narrative on file      Review of Systems  All other systems reviewed and are negative.      Objective:   Physical Exam   Constitutional: He appears well-developed and well-nourished.  Cardiovascular: Normal rate, regular rhythm and normal heart sounds.   Pulmonary/Chest: Effort normal and breath sounds normal. No respiratory distress. He has no wheezes. He has no rales.  Skin: Rash noted. There is erythema.  Vitals reviewed.         Assessment & Plan:  Insect bites  Recommended clobetasol cream twice daily to the insect bites. I believe these are likely mosquito bites. Patient has not seen any bedbugs. His blood pressure significantly elevated. He will check it everyday at home for the next week or so and report the values to me. If consistently greater than 140/90, we will institute treatment with losartan

## 2016-11-29 DIAGNOSIS — H6123 Impacted cerumen, bilateral: Secondary | ICD-10-CM | POA: Diagnosis not present

## 2016-11-29 DIAGNOSIS — H6091 Unspecified otitis externa, right ear: Secondary | ICD-10-CM | POA: Diagnosis not present

## 2017-05-24 DIAGNOSIS — H6092 Unspecified otitis externa, left ear: Secondary | ICD-10-CM | POA: Diagnosis not present

## 2017-05-24 DIAGNOSIS — H6122 Impacted cerumen, left ear: Secondary | ICD-10-CM | POA: Diagnosis not present

## 2017-10-16 DIAGNOSIS — M79602 Pain in left arm: Secondary | ICD-10-CM | POA: Diagnosis not present

## 2017-10-17 DIAGNOSIS — M25522 Pain in left elbow: Secondary | ICD-10-CM | POA: Diagnosis not present

## 2017-10-17 DIAGNOSIS — S46292A Other injury of muscle, fascia and tendon of other parts of biceps, left arm, initial encounter: Secondary | ICD-10-CM | POA: Diagnosis not present

## 2017-11-10 DIAGNOSIS — S46212A Strain of muscle, fascia and tendon of other parts of biceps, left arm, initial encounter: Secondary | ICD-10-CM | POA: Diagnosis not present

## 2017-11-10 DIAGNOSIS — S46292A Other injury of muscle, fascia and tendon of other parts of biceps, left arm, initial encounter: Secondary | ICD-10-CM | POA: Diagnosis not present

## 2017-11-24 DIAGNOSIS — M25622 Stiffness of left elbow, not elsewhere classified: Secondary | ICD-10-CM | POA: Diagnosis not present

## 2017-12-01 DIAGNOSIS — S46292D Other injury of muscle, fascia and tendon of other parts of biceps, left arm, subsequent encounter: Secondary | ICD-10-CM | POA: Diagnosis not present

## 2017-12-22 DIAGNOSIS — S46292A Other injury of muscle, fascia and tendon of other parts of biceps, left arm, initial encounter: Secondary | ICD-10-CM | POA: Diagnosis not present

## 2019-02-11 ENCOUNTER — Ambulatory Visit (INDEPENDENT_AMBULATORY_CARE_PROVIDER_SITE_OTHER): Payer: 59 | Admitting: Family Medicine

## 2019-02-11 ENCOUNTER — Encounter: Payer: Self-pay | Admitting: Family Medicine

## 2019-02-11 ENCOUNTER — Other Ambulatory Visit: Payer: Self-pay

## 2019-02-11 VITALS — BP 174/110 | HR 88 | Temp 97.3°F | Resp 16 | Ht 70.0 in | Wt 258.0 lb

## 2019-02-11 DIAGNOSIS — M5442 Lumbago with sciatica, left side: Secondary | ICD-10-CM | POA: Diagnosis not present

## 2019-02-11 DIAGNOSIS — I10 Essential (primary) hypertension: Secondary | ICD-10-CM

## 2019-02-11 MED ORDER — PREDNISONE 20 MG PO TABS: ORAL_TABLET | ORAL | 0 refills | Status: DC

## 2019-02-11 MED ORDER — AMLODIPINE BESYLATE 10 MG PO TABS: 10.0000 mg | ORAL_TABLET | Freq: Every day | ORAL | 3 refills | Status: DC

## 2019-02-11 MED ORDER — METHYLPREDNISOLONE ACETATE 40 MG/ML IJ SUSP
60.0000 mg | Freq: Once | INTRAMUSCULAR | Status: AC
  Administered 2019-02-11: 60 mg via INTRAMUSCULAR

## 2019-02-11 NOTE — Addendum Note (Signed)
Addended by: Shary Decamp B on: 02/11/2019 01:45 PM   Modules accepted: Orders

## 2019-02-11 NOTE — Progress Notes (Signed)
Subjective:    Patient ID: Chad [name], male    DOB: February 28, 1961, 58 y.o.   MRN: [id number]  HPI 2 weeks ago, the patient was remodeling his house.  He was lifting glass tiles and sitting them on the floor when he felt something pop in his lower back.  He instantly felt pain.  The pain would radiate into his left gluteus and down his left leg.  Over the last 2 weeks the pain is gradually improved however he continues to have neuropathic deep burning aching pain radiating into his left gluteus and down his left leg.  He denies any saddle anesthesia.  He denies any bowel or bladder incontinence.  His blood pressure today is extremely high.  I verified this myself.  He denies any chest pain shortness of breath or dyspnea on exertion.  He also denies any other causes of low back pain such as hematuria or dysuria Past Medical History:  Diagnosis Date  . Erectile dysfunction   . GERD (gastroesophageal reflux disease)   . Sleep apnea    Past Surgical History:  Procedure Laterality Date  . CERVICAL SPINE SURGERY  2002, 2004  . COLONOSCOPY N/A 02/09/2014   Procedure: COLONOSCOPY;  Surgeon: Malissa Hippo, MD;  Location: [facility];  Service: Endoscopy;  Laterality: N/A;  730 - rescheduled to 930 - Ann notified pt  . Left knee arthroscopy     Current Outpatient Medications on File Prior to Visit  Medication Sig Dispense Refill  . cetirizine (ZYRTEC) 10 MG tablet Take 10 mg by mouth daily.    . naproxen sodium (ALEVE) 220 MG tablet Take 220 mg by mouth.    Marland Kitchen omeprazole (PRILOSEC) 20 MG capsule Take 20 mg by mouth daily.     No current facility-administered medications on file prior to visit.    No Known Allergies Social History   Socioeconomic History  . Marital status: Divorced    Spouse name: Not on file  . Number of children: Not on file  . Years of education: Not on file  . Highest education level: Not on file  Occupational History  . Not on file  Social Needs  . Financial  resource strain: Not on file  . Food insecurity    Worry: Not on file    Inability: Not on file  . Transportation needs    Medical: Not on file    Non-medical: Not on file  Tobacco Use  . Smoking status: Never Smoker  Substance and Sexual Activity  . Alcohol use: Yes    Comment: Once a month  . Drug use: No  . Sexual activity: Not on file  Lifestyle  . Physical activity    Days per week: Not on file    Minutes per session: Not on file  . Stress: Not on file  Relationships  . Social Musician on phone: Not on file    Gets together: Not on file    Attends religious service: Not on file    Active member of club or organization: Not on file    Attends meetings of clubs or organizations: Not on file    Relationship status: Not on file  . Intimate partner violence    Fear of current or ex partner: Not on file    Emotionally abused: Not on file    Physically abused: Not on file    Forced sexual activity: Not on file  Other Topics Concern  . Not  on file  Social History Narrative  . Not on file     Review of Systems  All other systems reviewed and are negative.      Objective:   Physical Exam Vitals signs reviewed.  Constitutional:      General: He is not in acute distress.    Appearance: Normal appearance. He is not ill-appearing or toxic-appearing.  Cardiovascular:     Rate and Rhythm: Normal rate and regular rhythm.     Pulses: Normal pulses.     Heart sounds: Normal heart sounds.  Pulmonary:     Effort: Pulmonary effort is normal.     Breath sounds: Normal breath sounds. No wheezing, rhonchi or rales.  Musculoskeletal:     Lumbar back: He exhibits decreased range of motion and pain. He exhibits no tenderness and no bony tenderness.       Back:  Neurological:     General: No focal deficit present.     Mental Status: He is alert.     Motor: No weakness.     Coordination: Coordination normal.     Gait: Gait normal.     Deep Tendon Reflexes:  Reflexes normal.           Assessment & Plan:  Benign essential HTN - Plan: BASIC METABOLIC PANEL WITH GFR  Acute midline low back pain with left-sided sciatica  Symptoms are consistent with a herniated disc causing left-sided sciatica.  Begin Depo-Medrol 60 mg IM x1 and then begin prednisone taper pack starting tomorrow.  Hopefully this will help with the bulging disc by reducing the inflammation and improve his pain.  I am very concerned about his blood pressure.  Begin amlodipine 10 mg a day and check blood pressure daily and recheck blood pressure in 1 week.  Meanwhile check CMP to evaluate renal function.

## 2019-02-12 LAB — BASIC METABOLIC PANEL WITH GFR
BUN: 14 mg/dL (ref 7–25)
CO2: 29 mmol/L (ref 20–32)
Calcium: 9.7 mg/dL (ref 8.6–10.3)
Chloride: 100 mmol/L (ref 98–110)
Creat: 0.91 mg/dL (ref 0.70–1.33)
GFR, Est African American: 107 mL/min/{1.73_m2} (ref >=60)
GFR, Est Non African American: 93 mL/min/{1.73_m2} (ref >=60)
Glucose, Bld: 270 mg/dL — ABNORMAL HIGH (ref 65–99)
Potassium: 4.4 mmol/L (ref 3.5–5.3)
Sodium: 136 mmol/L (ref 135–146)

## 2019-02-15 ENCOUNTER — Other Ambulatory Visit: Payer: Self-pay | Admitting: Family Medicine

## 2019-02-15 DIAGNOSIS — R739 Hyperglycemia, unspecified: Secondary | ICD-10-CM

## 2019-02-16 ENCOUNTER — Other Ambulatory Visit: Payer: 59

## 2019-02-16 ENCOUNTER — Other Ambulatory Visit: Payer: Self-pay

## 2019-02-16 DIAGNOSIS — R739 Hyperglycemia, unspecified: Secondary | ICD-10-CM

## 2019-02-17 LAB — HEMOGLOBIN A1C
Hgb A1c MFr Bld: 8.4 % of total Hgb — ABNORMAL HIGH (ref <5.7)
Mean Plasma Glucose: 194 (calc)
eAG (mmol/L): 10.8 (calc)

## 2019-02-18 ENCOUNTER — Encounter: Payer: Self-pay | Admitting: Family Medicine

## 2019-02-18 DIAGNOSIS — E119 Type 2 diabetes mellitus without complications: Secondary | ICD-10-CM | POA: Insufficient documentation

## 2019-02-19 ENCOUNTER — Other Ambulatory Visit: Payer: Self-pay | Admitting: Family Medicine

## 2019-02-19 MED ORDER — METFORMIN HCL 500 MG PO TABS: 500.0000 mg | ORAL_TABLET | Freq: Two times a day (BID) | ORAL | 0 refills | Status: DC

## 2019-02-22 ENCOUNTER — Other Ambulatory Visit: Payer: Self-pay

## 2019-02-23 ENCOUNTER — Ambulatory Visit: Payer: 59 | Admitting: Family Medicine

## 2019-02-23 VITALS — BP 142/88 | HR 79 | Temp 98.3°F | Resp 18 | Ht 70.0 in | Wt 248.0 lb

## 2019-02-23 DIAGNOSIS — E119 Type 2 diabetes mellitus without complications: Secondary | ICD-10-CM | POA: Diagnosis not present

## 2019-02-23 DIAGNOSIS — I10 Essential (primary) hypertension: Secondary | ICD-10-CM | POA: Diagnosis not present

## 2019-02-23 MED ORDER — LISINOPRIL 20 MG PO TABS: 20.0000 mg | ORAL_TABLET | Freq: Every day | ORAL | 3 refills | Status: DC

## 2019-02-23 NOTE — Progress Notes (Signed)
Subjective:    Patient ID: Chad [name], male    DOB: 03/05/1961, 58 y.o.   MRN: [id number]  HPI  Please see the patient's last office visit.  Obtain lab work at that time due to his elevated blood pressure.  He was started on amlodipine.  Blood work was significant for a blood sugar greater than 200 concerning for diabetes.  A follow-up hemoglobin A1c was greater than 8 confirming type 2 diabetes mellitus.  Patient is here today with his significant other to discuss treatment strategies.  I have already started the patient on metformin and he is tolerating it well.  He denies any diarrhea or stomach upset on the medication.  He denies any chest pain shortness of breath or dyspnea on exertion.  His blood pressure at home has been between 130 and 140 over 80s.  He denies any side effects from the amlodipine. Past Medical History:  Diagnosis Date  . Diabetes mellitus type 2 in nonobese (HCC)   . Erectile dysfunction   . GERD (gastroesophageal reflux disease)   . Sleep apnea    Past Surgical History:  Procedure Laterality Date  . CERVICAL SPINE SURGERY  2002, 2004  . COLONOSCOPY N/A 02/09/2014   Procedure: COLONOSCOPY;  Surgeon: Malissa Hippo, MD;  Location: [facility];  Service: Endoscopy;  Laterality: N/A;  730 - rescheduled to 930 - Ann notified pt  . Left knee arthroscopy     Current Outpatient Medications on File Prior to Visit  Medication Sig Dispense Refill  . amLODipine (NORVASC) 10 MG tablet Take 1 tablet (10 mg total) by mouth daily. 30 tablet 3  . cetirizine (ZYRTEC) 10 MG tablet Take 10 mg by mouth daily.    . metFORMIN (GLUCOPHAGE) 500 MG tablet Take 1 tablet (500 mg total) by mouth 2 (two) times daily with a meal. 60 tablet 0  . naproxen sodium (ALEVE) 220 MG tablet Take 220 mg by mouth.    Marland Kitchen omeprazole (PRILOSEC) 20 MG capsule Take 20 mg by mouth daily.     No current facility-administered medications on file prior to visit.    No Known Allergies Social History    Socioeconomic History  . Marital status: Divorced    Spouse name: Not on file  . Number of children: Not on file  . Years of education: Not on file  . Highest education level: Not on file  Occupational History  . Not on file  Social Needs  . Financial resource strain: Not on file  . Food insecurity    Worry: Not on file    Inability: Not on file  . Transportation needs    Medical: Not on file    Non-medical: Not on file  Tobacco Use  . Smoking status: Never Smoker  . Smokeless tobacco: Current User    Types: Snuff  Substance and Sexual Activity  . Alcohol use: Yes    Comment: Once a month  . Drug use: No  . Sexual activity: Not on file  Lifestyle  . Physical activity    Days per week: Not on file    Minutes per session: Not on file  . Stress: Not on file  Relationships  . Social Musician on phone: Not on file    Gets together: Not on file    Attends religious service: Not on file    Active member of club or organization: Not on file    Attends meetings of clubs or organizations:  Not on file    Relationship status: Not on file  . Intimate partner violence    Fear of current or ex partner: Not on file    Emotionally abused: Not on file    Physically abused: Not on file    Forced sexual activity: Not on file  Other Topics Concern  . Not on file  Social History Narrative  . Not on file     Review of Systems  All other systems reviewed and are negative.      Objective:   Physical Exam Vitals signs reviewed.  Cardiovascular:     Rate and Rhythm: Normal rate and regular rhythm.     Heart sounds: Normal heart sounds.  Pulmonary:     Effort: Pulmonary effort is normal.     Breath sounds: Normal breath sounds.           Assessment & Plan:  Diabetes mellitus type 2 in nonobese (HCC)  Benign essential HTN  More than 30 minutes was spent today with the patient discussing his diagnosis.  I have recommended he continue the metformin at the  present time.  I have also recommended a low carbohydrate diet.  I recommended less than 45 g of carbohydrates per meal and less than 15 g of carbohydrates per snack.  I recommended 15 to 20 pounds of weight loss and 30 minutes a day 5 days a week of aerobic exercise.  Patient will return in 3 months to obtain fasting lab work to recheck a hemoglobin A1c, a CMP, fasting lipid panel, and a urine microalbumin at that time.  I have also recommended a diabetic eye exam.  I recommended that we discontinue amlodipine due to the diabetes and replace that medication with lisinopril 20 mg a day.  Of asked him to call me in 2 weeks and give me an update on his blood pressure.  If blood pressures greater than 140/90 at that time I will increase lisinopril to 40 mg a day.  Also recommended an aspirin 81 mg a day.  We also discussed starting a statin.  I explained to the patient that his ten-year risk for cardiovascular disease will be greater than 10% no matter what his cholesterol is due to his age, his hypertension, and his diabetes.  Therefore he would qualify for a statin irregardless of his cholesterol.  Patient is hesitant to start a statin at the present time and would like to wait until his 81-month follow-up and discuss this further then.

## 2019-03-15 ENCOUNTER — Other Ambulatory Visit: Payer: Self-pay | Admitting: Family Medicine

## 2019-06-16 ENCOUNTER — Other Ambulatory Visit: Payer: Self-pay | Admitting: Family Medicine

## 2019-06-29 ENCOUNTER — Ambulatory Visit: Payer: 59 | Admitting: Family Medicine

## 2019-06-29 ENCOUNTER — Other Ambulatory Visit: Payer: Self-pay

## 2019-06-29 VITALS — BP 142/90 | HR 82 | Temp 97.6°F | Resp 16 | Ht 70.0 in | Wt 233.0 lb

## 2019-06-29 DIAGNOSIS — Z79899 Other long term (current) drug therapy: Secondary | ICD-10-CM

## 2019-06-29 DIAGNOSIS — I10 Essential (primary) hypertension: Secondary | ICD-10-CM | POA: Diagnosis not present

## 2019-06-29 DIAGNOSIS — E119 Type 2 diabetes mellitus without complications: Secondary | ICD-10-CM | POA: Diagnosis not present

## 2019-06-29 NOTE — Progress Notes (Signed)
Subjective:    Patient ID: Chad [name], male    DOB: 12-04-1960, 59 y.o.   MRN: [id number]  HPI 02/23/19 Please see the patient's last office visit.  Obtain lab work at that time due to his elevated blood pressure.  He was started on amlodipine.  Blood work was significant for a blood sugar greater than 200 concerning for diabetes.  A follow-up hemoglobin A1c was greater than 8 confirming type 2 diabetes mellitus.  Patient is here today with his significant other to discuss treatment strategies.  I have already started the patient on metformin and he is tolerating it well.  He denies any diarrhea or stomach upset on the medication.  He denies any chest pain shortness of breath or dyspnea on exertion.  His blood pressure at home has been between 130 and 140 over 80s.  He denies any side effects from the amlodipine.  At that time, my plan was:  More than 30 minutes was spent today with the patient discussing his diagnosis.  I have recommended he continue the metformin at the present time.  I have also recommended a low carbohydrate diet.  I recommended less than 45 g of carbohydrates per meal and less than 15 g of carbohydrates per snack.  I recommended 15 to 20 pounds of weight loss and 30 minutes a day 5 days a week of aerobic exercise.  Patient will return in 3 months to obtain fasting lab work to recheck a hemoglobin A1c, a CMP, fasting lipid panel, and a urine microalbumin at that time.  I have also recommended a diabetic eye exam.  I recommended that we discontinue amlodipine due to the diabetes and replace that medication with lisinopril 20 mg a day.  Of asked him to call me in 2 weeks and give me an update on his blood pressure.  If blood pressures greater than 140/90 at that time I will increase lisinopril to 40 mg a day.  Also recommended an aspirin 81 mg a day.  We also discussed starting a statin.  I explained to the patient that his ten-year risk for cardiovascular disease will be greater than  10% no matter what his cholesterol is due to his age, his hypertension, and his diabetes.  Therefore he would qualify for a statin irregardless of his cholesterol.  Patient is hesitant to start a statin at the present time and would like to wait until his 60-month follow-up and discuss this further then.  06/29/19 Patient is here today for follow-up.  Since I last saw the patient, he has made drastic changes in his diet.  He is trying to adhere to a low carbohydrate diet using less than 45 g of carbs per meal.  He has switched his bread.  He is avoiding desserts.  He is trying to exercise regularly.  As result, the patient has lost 35 pounds!.  He is not checking his blood sugar.  He denies any chest pain shortness of breath dyspnea on exertion.  He denies any polyuria, polydipsia, or blurry vision.  He states that he feels fine.  He denies any hypoglycemic episodes.  He is compliant taking his Metformin.  He denies any diarrhea or abdominal discomfort on the Metformin.  He is tolerating the lisinopril well.  Although his blood pressure is elevated here, the patient states that his blood pressure at home has been 120-130/70-80.   Past Medical History:  Diagnosis Date  . Diabetes mellitus type 2 in nonobese (HCC)   .  Erectile dysfunction   . GERD (gastroesophageal reflux disease)   . Sleep apnea    Past Surgical History:  Procedure Laterality Date  . CERVICAL SPINE SURGERY  2002, 2004  . COLONOSCOPY N/A 02/09/2014   Procedure: COLONOSCOPY;  Surgeon: Malissa Hippo, MD;  Location: [facility];  Service: Endoscopy;  Laterality: N/A;  730 - rescheduled to 930 - Ann notified pt  . Left knee arthroscopy     Current Outpatient Medications on File Prior to Visit  Medication Sig Dispense Refill  . cetirizine (ZYRTEC) 10 MG tablet Take 10 mg by mouth daily.    Marland Kitchen lisinopril (ZESTRIL) 20 MG tablet Take 1 tablet (20 mg total) by mouth daily. Stop amlodipine 90 tablet 3  . metFORMIN (GLUCOPHAGE) 500 MG  tablet TAKE 1 TABLET BY MOUTH 2 TIMES DAILY WITH A MEAL. 180 tablet 2  . omeprazole (PRILOSEC) 20 MG capsule Take 20 mg by mouth daily.     No current facility-administered medications on file prior to visit.   No Known Allergies Social History   Socioeconomic History  . Marital status: Divorced    Spouse name: Not on file  . Number of children: Not on file  . Years of education: Not on file  . Highest education level: Not on file  Occupational History  . Not on file  Tobacco Use  . Smoking status: Never Smoker  . Smokeless tobacco: Current User    Types: Snuff  Substance and Sexual Activity  . Alcohol use: Yes    Comment: Once a month  . Drug use: No  . Sexual activity: Not on file  Other Topics Concern  . Not on file  Social History Narrative  . Not on file   Social Determinants of Health   Financial Resource Strain:   . Difficulty of Paying Living Expenses: Not on file  Food Insecurity:   . Worried About Programme researcher, broadcasting/film/video in the Last Year: Not on file  . Ran Out of Food in the Last Year: Not on file  Transportation Needs:   . Lack of Transportation (Medical): Not on file  . Lack of Transportation (Non-Medical): Not on file  Physical Activity:   . Days of Exercise per Week: Not on file  . Minutes of Exercise per Session: Not on file  Stress:   . Feeling of Stress : Not on file  Social Connections:   . Frequency of Communication with Friends and Family: Not on file  . Frequency of Social Gatherings with Friends and Family: Not on file  . Attends Religious Services: Not on file  . Active Member of Clubs or Organizations: Not on file  . Attends Banker Meetings: Not on file  . Marital Status: Not on file  Intimate Partner Violence:   . Fear of Current or Ex-Partner: Not on file  . Emotionally Abused: Not on file  . Physically Abused: Not on file  . Sexually Abused: Not on file     Review of Systems  All other systems reviewed and are  negative.      Objective:   Physical Exam Vitals reviewed.  Cardiovascular:     Rate and Rhythm: Normal rate and regular rhythm.     Heart sounds: Normal heart sounds.  Pulmonary:     Effort: Pulmonary effort is normal.     Breath sounds: Normal breath sounds.           Assessment & Plan:  Diabetes mellitus type 2 in  nonobese ([place]) - Plan: Comprehensive metabolic panel, Hemoglobin A1c, Lipid panel  Benign essential HTN - Plan: Comprehensive metabolic panel, Hemoglobin A1c, Lipid panel  Encounter for long-term (current) use of medications - Plan: Comprehensive metabolic panel, Hemoglobin A1c, Lipid panel Exam today is completely normal.  Although blood pressure is elevated here, the patient's blood pressure at home is well controlled.  Therefore I will not increase his lisinopril or add any additional medication.  I congratulated the patient on changing his lifestyle and achieving substantial weight loss.  If his hemoglobin A1c is well below 6.5, I will not add a statin at this time as I believe the patient may have reversed his diabetes and may now be more in the prediabetic range.  If his hemoglobin A1c is still 6.5 or greater, in addition to lowering his blood sugar, I would recommend starting a statin.  I will check a hemoglobin A1c, CMP, and a fasting lipid panel.  If labs are excellent, recheck the patient in 6 months.  Congratulated the patient on his hard work.

## 2019-06-30 LAB — HEMOGLOBIN A1C
Hgb A1c MFr Bld: 5.4 % of total Hgb (ref <5.7)
Mean Plasma Glucose: 108 (calc)
eAG (mmol/L): 6 (calc)

## 2019-06-30 LAB — COMPREHENSIVE METABOLIC PANEL
AG Ratio: 1.9 (calc) (ref 1.0–2.5)
ALT: 23 U/L (ref 9–46)
AST: 16 U/L (ref 10–35)
Albumin: 4.6 g/dL (ref 3.6–5.1)
Alkaline phosphatase (APISO): 57 U/L (ref 35–144)
BUN: 17 mg/dL (ref 7–25)
CO2: 29 mmol/L (ref 20–32)
Calcium: 9.9 mg/dL (ref 8.6–10.3)
Chloride: 102 mmol/L (ref 98–110)
Creat: 0.92 mg/dL (ref 0.70–1.33)
Globulin: 2.4 g/dL (calc) (ref 1.9–3.7)
Glucose, Bld: 106 mg/dL — ABNORMAL HIGH (ref 65–99)
Potassium: 4.7 mmol/L (ref 3.5–5.3)
Sodium: 141 mmol/L (ref 135–146)
Total Bilirubin: 0.4 mg/dL (ref 0.2–1.2)
Total Protein: 7 g/dL (ref 6.1–8.1)

## 2019-06-30 LAB — LIPID PANEL
Cholesterol: 161 mg/dL (ref <200)
HDL: 33 mg/dL — ABNORMAL LOW (ref >=40)
LDL Cholesterol (Calc): 95 mg/dL (calc)
Non-HDL Cholesterol (Calc): 128 mg/dL (calc) (ref <130)
Total CHOL/HDL Ratio: 4.9 (calc) (ref <5.0)
Triglycerides: 239 mg/dL — ABNORMAL HIGH (ref <150)

## 2019-07-02 ENCOUNTER — Encounter: Payer: Self-pay | Admitting: Family Medicine

## 2019-07-06 ENCOUNTER — Other Ambulatory Visit: Payer: Self-pay | Admitting: Family Medicine

## 2019-07-06 MED ORDER — SILDENAFIL CITRATE 100 MG PO TABS: 100.0000 mg | ORAL_TABLET | Freq: Every day | ORAL | 2 refills | Status: DC | PRN

## 2019-07-24 ENCOUNTER — Ambulatory Visit: Payer: Self-pay | Attending: Internal Medicine

## 2019-07-24 DIAGNOSIS — Z23 Encounter for immunization: Secondary | ICD-10-CM

## 2019-07-24 NOTE — Progress Notes (Signed)
   Covid-19 Vaccination Clinic  Name:  Chad [name]    MRN: [id number] DOB: 31-Oct-1960  07/24/2019  [name] was observed post Covid-19 immunization for 15 minutes without incident. He was provided with Vaccine Information Sheet and instruction to access the V-Safe system.   [name] was instructed to call 911 with any severe reactions post vaccine: Marland Kitchen Difficulty breathing  . Swelling of face and throat  . A fast heartbeat  . A bad rash all over body  . Dizziness and weakness   Immunizations Administered    Name Date Dose VIS Date Route   Pfizer COVID-19 Vaccine 07/24/2019  2:39 PM 0.3 mL 04/23/2019 Intramuscular   Manufacturer: [employer], [employer]   Lot: [id number]   NDC: [address]-7

## 2019-08-18 ENCOUNTER — Ambulatory Visit: Payer: Self-pay | Attending: Internal Medicine

## 2019-08-18 DIAGNOSIS — Z23 Encounter for immunization: Secondary | ICD-10-CM

## 2019-08-18 NOTE — Progress Notes (Signed)
   Covid-19 Vaccination Clinic  Name:  Chad [name]    MRN: [id number] DOB: [birth year]  08/18/2019  Chad [name] was observed post Covid-19 immunization for 15 minutes without incident. He was provided with Vaccine Information Sheet and instruction to access the V-Safe system.   Chad [name] was instructed to call 911 with any severe reactions post vaccine: Marland Kitchen Difficulty breathing  . Swelling of face and throat  . A fast heartbeat  . A bad rash all over body  . Dizziness and weakness   Immunizations Administered    Name Date Dose VIS Date Route   Pfizer COVID-19 Vaccine 08/18/2019 11:33 AM 0.3 mL 04/23/2019 Intramuscular   Manufacturer: [employer], [employer]   Lot: [id number]   NDC: [address]-7

## 2020-02-16 ENCOUNTER — Other Ambulatory Visit: Payer: Self-pay | Admitting: Family Medicine

## 2020-04-03 ENCOUNTER — Other Ambulatory Visit: Payer: Self-pay

## 2020-04-03 MED ORDER — METFORMIN HCL 500 MG PO TABS: ORAL_TABLET | ORAL | 2 refills | Status: DC

## 2020-10-31 ENCOUNTER — Other Ambulatory Visit: Payer: Self-pay

## 2020-10-31 ENCOUNTER — Ambulatory Visit: Payer: 59 | Admitting: Family Medicine

## 2020-10-31 ENCOUNTER — Encounter: Payer: Self-pay | Admitting: Family Medicine

## 2020-10-31 VITALS — BP 156/80 | HR 82 | Temp 98.2°F | Resp 14 | Ht 70.0 in | Wt 240.0 lb

## 2020-10-31 DIAGNOSIS — I10 Essential (primary) hypertension: Secondary | ICD-10-CM | POA: Diagnosis not present

## 2020-10-31 DIAGNOSIS — E119 Type 2 diabetes mellitus without complications: Secondary | ICD-10-CM

## 2020-10-31 DIAGNOSIS — M5442 Lumbago with sciatica, left side: Secondary | ICD-10-CM | POA: Diagnosis not present

## 2020-10-31 DIAGNOSIS — G8929 Other chronic pain: Secondary | ICD-10-CM

## 2020-10-31 MED ORDER — METFORMIN HCL 500 MG PO TABS: ORAL_TABLET | ORAL | 2 refills | Status: DC

## 2020-10-31 MED ORDER — SILDENAFIL CITRATE 100 MG PO TABS: 100.0000 mg | ORAL_TABLET | Freq: Every day | ORAL | 2 refills | Status: DC | PRN

## 2020-10-31 MED ORDER — LISINOPRIL 20 MG PO TABS: ORAL_TABLET | ORAL | 3 refills | Status: DC

## 2020-10-31 NOTE — Progress Notes (Signed)
Subjective:    Patient ID: Chad [name], male    DOB: 1960/12/11, 60 y.o.   MRN: [id number]  Patient is a very pleasant 60 year old Caucasian male who presents today with back pain.  He states that his pain originally started many years ago.  He was trying to lift a Christmas tree to take it apart when he felt a sudden severe shooting pain in his lower back that radiated into his left leg.  He saw his primary care physician at that time and received a shot of steroids which made the pain much better.  However ever since that time it has never gone away.  I saw the patient approximately 1 year ago for similar symptoms.  However during the work-up, we discovered that the patient was a type II diabetic.  He states that he is continuing to have low back pain.  Is been present now off and on for a year.  He states that if he sits a certain way or if he twists a certain way he can feel pain radiate into his left gluteus and down his left leg.  He has herniated disc in his neck before and so he is very familiar with radicular pain.  He states is not as severe as the cervical radiculopathy that he experienced in the past.  He denies any leg weakness.  He denies any numbness in his leg or tingling in his feet or toes.  The pain seems to radiate from roughly the level of L4-L5 into his left gluteus and posterior left thigh.  He denies any bowel or bladder incontinence Past Medical History:  Diagnosis Date   Diabetes mellitus type 2 in nonobese [facility])    Erectile dysfunction    GERD (gastroesophageal reflux disease)    Sleep apnea    Past Surgical History:  Procedure Laterality Date   CERVICAL SPINE SURGERY  2002, 2004   COLONOSCOPY N/A 02/09/2014   Procedure: COLONOSCOPY;  Surgeon: Malissa Hippo, MD;  [facility];  Service: Endoscopy;  Laterality: N/A;  730 - rescheduled to 930 - Ann notified pt   Left knee arthroscopy     Current Outpatient Medications on File Prior to Visit  Medication Sig  Dispense Refill   cetirizine (ZYRTEC) 10 MG tablet Take 10 mg by mouth daily.     lisinopril (ZESTRIL) 20 MG tablet TAKE 1 TABLET BY MOUTH DAILY. STOP AMLODIPINE 90 tablet 3   metFORMIN (GLUCOPHAGE) 500 MG tablet TAKE 1 TABLET BY MOUTH 2 TIMES DAILY WITH A MEAL. 180 tablet 2   omeprazole (PRILOSEC) 20 MG capsule Take 20 mg by mouth daily.     sildenafil (VIAGRA) 100 MG tablet Take 1 tablet (100 mg total) by mouth daily as needed for erectile dysfunction. 30 tablet 2   No current facility-administered medications on file prior to visit.   No Known Allergies Social History   Socioeconomic History   Marital status: Divorced    Spouse name: Not on file   Number of children: Not on file   Years of education: Not on file   Highest education level: Not on file  Occupational History   Not on file  Tobacco Use   Smoking status: Never   Smokeless tobacco: Current    Types: Snuff  Substance and Sexual Activity   Alcohol use: Yes    Comment: Once a month   Drug use: No   Sexual activity: Not on file  Other Topics Concern   Not  on file  Social History Narrative   Not on file   Social Determinants of Health   Financial Resource Strain: Not on file  Food Insecurity: Not on file  Transportation Needs: Not on file  Physical Activity: Not on file  Stress: Not on file  Social Connections: Not on file  Intimate Partner Violence: Not on file     Review of Systems  All other systems reviewed and are negative.     Objective:   Physical Exam Vitals reviewed.  Cardiovascular:     Rate and Rhythm: Normal rate and regular rhythm.     Heart sounds: Normal heart sounds.  Pulmonary:     Effort: Pulmonary effort is normal.     Breath sounds: Normal breath sounds.  Musculoskeletal:     Lumbar back: Spasms and tenderness present. No bony tenderness. Decreased range of motion. Negative right straight leg raise test and negative left straight leg raise test.       Back:           Assessment & Plan:  Diabetes mellitus type 2 in nonobese (HCC) - Plan: metFORMIN (GLUCOPHAGE) 500 MG tablet, lisinopril (ZESTRIL) 20 MG tablet, sildenafil (VIAGRA) 100 MG tablet, Hemoglobin A1c, CBC with Differential/Platelet, COMPLETE METABOLIC PANEL WITH GFR, Hemoglobin A1c  Benign essential HTN  Acute midline low back pain with left-sided sciatica Patient has had low back pain with left-sided sciatica now off and on for a year however its become more consistent and persistent and is affecting his quality of life.  I will try to schedule the patient for an MRI of the lumbar spine to evaluate further.  Meanwhile while the patient is here, I will check an A1c, CBC, CMP to evaluate the management of his diabetes.  He admits that he has been inconsistent with his diet however on to determine the severity of his diabetes.  His blood pressure is also elevated today and we may need to make additional changes in his antihypertensive medication once I evaluate his renal function

## 2020-11-01 LAB — COMPLETE METABOLIC PANEL WITH GFR
AG Ratio: 1.9 (calc) (ref 1.0–2.5)
ALT: 32 U/L (ref 9–46)
AST: 24 U/L (ref 10–35)
Albumin: 4.6 g/dL (ref 3.6–5.1)
Alkaline phosphatase (APISO): 47 U/L (ref 35–144)
BUN: 13 mg/dL (ref 7–25)
CO2: 26 mmol/L (ref 20–32)
Calcium: 9.7 mg/dL (ref 8.6–10.3)
Chloride: 101 mmol/L (ref 98–110)
Creat: 0.85 mg/dL (ref 0.70–1.25)
GFR, Est African American: 110 mL/min/{1.73_m2} (ref >=60)
GFR, Est Non African American: 95 mL/min/{1.73_m2} (ref >=60)
Globulin: 2.4 g/dL (calc) (ref 1.9–3.7)
Glucose, Bld: 86 mg/dL (ref 65–99)
Potassium: 4.6 mmol/L (ref 3.5–5.3)
Sodium: 138 mmol/L (ref 135–146)
Total Bilirubin: 0.6 mg/dL (ref 0.2–1.2)
Total Protein: 7 g/dL (ref 6.1–8.1)

## 2020-11-01 LAB — CBC WITH DIFFERENTIAL/PLATELET
Absolute Monocytes: 639 cells/uL (ref 200–950)
Basophils Absolute: 81 cells/uL (ref 0–200)
Basophils Relative: 0.9 %
Eosinophils Absolute: 72 cells/uL (ref 15–500)
Eosinophils Relative: 0.8 %
HCT: 44.4 % (ref 38.5–50.0)
Hemoglobin: 15 g/dL (ref 13.2–17.1)
Lymphs Abs: 2502 cells/uL (ref 850–3900)
MCH: 31.5 pg (ref 27.0–33.0)
MCHC: 33.8 g/dL (ref 32.0–36.0)
MCV: 93.3 fL (ref 80.0–100.0)
MPV: 12.1 fL (ref 7.5–12.5)
Monocytes Relative: 7.1 %
Neutro Abs: 5706 cells/uL (ref 1500–7800)
Neutrophils Relative %: 63.4 %
Platelets: 192 10*3/uL (ref 140–400)
RBC: 4.76 10*6/uL (ref 4.20–5.80)
RDW: 12.4 % (ref 11.0–15.0)
Total Lymphocyte: 27.8 %
WBC: 9 10*3/uL (ref 3.8–10.8)

## 2020-11-01 LAB — HEMOGLOBIN A1C
Hgb A1c MFr Bld: 5.4 % of total Hgb (ref <5.7)
Mean Plasma Glucose: 108 mg/dL
eAG (mmol/L): 6 mmol/L

## 2020-11-10 ENCOUNTER — Other Ambulatory Visit: Payer: Self-pay

## 2020-11-10 ENCOUNTER — Ambulatory Visit
Admission: RE | Admit: 2020-11-10 | Discharge: 2020-11-10 | Disposition: A | Discharge status: Home / Self Care | Payer: 59 | Source: Ambulatory Visit | Attending: Family Medicine | Admitting: Family Medicine

## 2020-11-10 DIAGNOSIS — G8929 Other chronic pain: Secondary | ICD-10-CM

## 2020-11-10 DIAGNOSIS — M5442 Lumbago with sciatica, left side: Secondary | ICD-10-CM

## 2020-11-21 ENCOUNTER — Other Ambulatory Visit: Payer: Self-pay

## 2020-11-21 ENCOUNTER — Encounter: Payer: Self-pay | Admitting: Family Medicine

## 2020-11-21 ENCOUNTER — Ambulatory Visit (INDEPENDENT_AMBULATORY_CARE_PROVIDER_SITE_OTHER): Payer: 59 | Admitting: Family Medicine

## 2020-11-21 VITALS — BP 160/100 | HR 70 | Temp 98.8°F | Ht 70.0 in | Wt 239.4 lb

## 2020-11-21 DIAGNOSIS — Z0001 Encounter for general adult medical examination with abnormal findings: Secondary | ICD-10-CM

## 2020-11-21 DIAGNOSIS — E119 Type 2 diabetes mellitus without complications: Secondary | ICD-10-CM

## 2020-11-21 DIAGNOSIS — M5442 Lumbago with sciatica, left side: Secondary | ICD-10-CM

## 2020-11-21 DIAGNOSIS — Z23 Encounter for immunization: Secondary | ICD-10-CM

## 2020-11-21 DIAGNOSIS — Z125 Encounter for screening for malignant neoplasm of prostate: Secondary | ICD-10-CM

## 2020-11-21 DIAGNOSIS — Z0000 Encounter for general adult medical examination without abnormal findings: Secondary | ICD-10-CM

## 2020-11-21 DIAGNOSIS — G8929 Other chronic pain: Secondary | ICD-10-CM

## 2020-11-21 DIAGNOSIS — I10 Essential (primary) hypertension: Secondary | ICD-10-CM

## 2020-11-21 MED ORDER — HYDROCHLOROTHIAZIDE 25 MG PO TABS: 25.0000 mg | ORAL_TABLET | Freq: Every day | ORAL | 3 refills | Status: DC

## 2020-11-21 NOTE — Progress Notes (Signed)
Subjective:    Patient ID: Chad [name], male    DOB: [birth year], 60 y.o.   MRN: [id number] 10/31/20 Patient is a very pleasant 60 year old Caucasian male who presents today with back pain.  He states that his pain originally started many years ago.  He was trying to lift a Christmas tree to take it apart when he felt a sudden severe shooting pain in his lower back that radiated into his left leg.  He saw his primary care physician at that time and received a shot of steroids which made the pain much better.  However ever since that time it has never gone away.  I saw the patient approximately 1 year ago for similar symptoms.  However during the work-up, we discovered that the patient was a type II diabetic.  He states that he is continuing to have low back pain.  Is been present now off and on for a year.  He states that if he sits a certain way or if he twists a certain way he can feel pain radiate into his left gluteus and down his left leg.  He has herniated disc in his neck before and so he is very familiar with radicular pain.  He states is not as severe as the cervical radiculopathy that he experienced in the past.  He denies any leg weakness.  He denies any numbness in his leg or tingling in his feet or toes.  The pain seems to radiate from roughly the level of L4-L5 into his left gluteus and posterior left thigh.  He denies any bowel or bladder incontinence.  AT that time, my plan was:  Patient has had low back pain with left-sided sciatica now off and on for a year however its become more consistent and persistent and is affecting his quality of life.  I will try to schedule the patient for an MRI of the lumbar spine to evaluate further.  Meanwhile while the patient is here, I will check an A1c, CBC, CMP to evaluate the management of his diabetes.  He admits that he has been inconsistent with his diet however on to determine the severity of his diabetes.  His blood pressure is also elevated today  and we may need to make additional changes in his antihypertensive medication once I evaluate his renal function  11/21/20 Patient is here today for complete physical exam.  His last colonoscopy was in 2015.  At that point he was given a clear bill of health for 10 years.  He is due again in 2025.  He is due for a PSA to screen for prostate cancer.  I recently checked an MRI for the reasons listed above.  Patient had a bulging disc between L3 and L4 abutting the left L3 nerve root and displacing it.  This was also causing foraminal stenosis on the left-hand side.  I believe that this was what was causing his left-sided sciatica.  However the pain has improved and he no longer wants to see a neurosurgeon at the present time.  His most recent lab work is listed below  Office Visit on 10/31/2020  Component Date Value Ref Range Status   Hgb A1c MFr Bld 10/31/2020 5.4  <5.7 % of total Hgb Final   Comment: For the purpose of screening for the presence of diabetes: . <5.7%       Consistent with the absence of diabetes 5.7-6.4%    Consistent with increased risk for diabetes             (  prediabetes) > or =6.5%  Consistent with diabetes . This assay result is consistent with a decreased risk of diabetes. . Currently, no consensus exists regarding use of hemoglobin A1c for diagnosis of diabetes in children. . According to American Diabetes Association (ADA) guidelines, hemoglobin A1c <7.0% represents optimal control in non-pregnant diabetic patients. Different metrics may apply to specific patient populations.  Standards of Medical Care in Diabetes(ADA). .    Mean Plasma Glucose 10/31/2020 108  mg/dL Final   eAG (mmol/L) 81/82/9937 6.0  mmol/L Final   WBC 10/31/2020 9.0  3.8 - 10.8 Thousand/uL Final   RBC 10/31/2020 4.76  4.20 - 5.80 Million/uL Final   Hemoglobin 10/31/2020 15.0  13.2 - 17.1 g/dL Final   HCT 16/96/7893 44.4  38.5 - 50.0 % Final   MCV 10/31/2020 93.3  80.0 - 100.0 fL Final    MCH 10/31/2020 31.5  27.0 - 33.0 pg Final   MCHC 10/31/2020 33.8  32.0 - 36.0 g/dL Final   RDW 81/05/7508 12.4  11.0 - 15.0 % Final   Platelets 10/31/2020 192  140 - 400 Thousand/uL Final   MPV 10/31/2020 12.1  7.5 - 12.5 fL Final   Neutro Abs 10/31/2020 5,706  1,500 - 7,800 cells/uL Final   Lymphs Abs 10/31/2020 2,502  850 - 3,900 cells/uL Final   Absolute Monocytes 10/31/2020 639  200 - 950 cells/uL Final   Eosinophils Absolute 10/31/2020 72  15 - 500 cells/uL Final   Basophils Absolute 10/31/2020 81  0 - 200 cells/uL Final   Neutrophils Relative % 10/31/2020 63.4  % Final   Total Lymphocyte 10/31/2020 27.8  % Final   Monocytes Relative 10/31/2020 7.1  % Final   Eosinophils Relative 10/31/2020 0.8  % Final   Basophils Relative 10/31/2020 0.9  % Final   Glucose, Bld 10/31/2020 86  65 - 99 mg/dL Final   Comment: .            Fasting reference interval .    BUN 10/31/2020 13  7 - 25 mg/dL Final   Creat 25/85/2778 0.85  0.70 - 1.25 mg/dL Final   Comment: For patients >77 years of age, the reference limit for Creatinine is approximately 13% higher for people identified as African-American. .    GFR, Est Non African American 10/31/2020 95  > OR = 60 mL/min/1.10m2 Final   GFR, Est African American 10/31/2020 110  > OR = 60 mL/min/1.43m2 Final   BUN/Creatinine Ratio 10/31/2020 NOT APPLICABLE  6 - 22 (calc) Final   Sodium 10/31/2020 138  135 - 146 mmol/L Final   Potassium 10/31/2020 4.6  3.5 - 5.3 mmol/L Final   Chloride 10/31/2020 101  98 - 110 mmol/L Final   CO2 10/31/2020 26  20 - 32 mmol/L Final   Calcium 10/31/2020 9.7  8.6 - 10.3 mg/dL Final   Total Protein 24/23/5361 7.0  6.1 - 8.1 g/dL Final   Albumin 44/31/5400 4.6  3.6 - 5.1 g/dL Final   Globulin 86/76/1950 2.4  1.9 - 3.7 g/dL (calc) Final   AG Ratio 10/31/2020 1.9  1.0 - 2.5 (calc) Final   Total Bilirubin 10/31/2020 0.6  0.2 - 1.2 mg/dL Final   Alkaline phosphatase (APISO) 10/31/2020 47  35 - 144 U/L Final   AST  10/31/2020 24  10 - 35 U/L Final   ALT 10/31/2020 32  9 - 46 U/L Final   He is due for a PSA and also a fasting lipid panel.  However his A1c is outstanding.  I am concerned by his elevated blood pressure today and he has been getting blood pressures between 140 and 150 at home.  He denies any chest pain shortness of breath or dyspnea on exertion.  He is due for Pneumovax 23 today given his history of diabetes Past Medical History:  Diagnosis Date   Diabetes mellitus type 2 in nonobese [facility])    Erectile dysfunction    GERD (gastroesophageal reflux disease)    Sleep apnea    Past Surgical History:  Procedure Laterality Date   CERVICAL SPINE SURGERY  2002, 2004   COLONOSCOPY N/A 02/09/2014   Procedure: COLONOSCOPY;  Surgeon: Malissa Hippo, MD;  [facility];  Service: Endoscopy;  Laterality: N/A;  730 - rescheduled to 930 - Ann notified pt   Left knee arthroscopy     Current Outpatient Medications on File Prior to Visit  Medication Sig Dispense Refill   cetirizine (ZYRTEC) 10 MG tablet Take 10 mg by mouth daily.     lisinopril (ZESTRIL) 20 MG tablet TAKE 1 TABLET BY MOUTH DAILY. STOP AMLODIPINE 90 tablet 3   metFORMIN (GLUCOPHAGE) 500 MG tablet TAKE 1 TABLET BY MOUTH 2 TIMES DAILY WITH A MEAL. 180 tablet 2   omeprazole (PRILOSEC) 20 MG capsule Take 20 mg by mouth daily.     sildenafil (VIAGRA) 100 MG tablet Take 1 tablet (100 mg total) by mouth daily as needed for erectile dysfunction. 30 tablet 2   No current facility-administered medications on file prior to visit.   No Known Allergies Social History   Socioeconomic History   Marital status: Divorced    Spouse name: Not on file   Number of children: Not on file   Years of education: Not on file   Highest education level: Not on file  Occupational History   Not on file  Tobacco Use   Smoking status: Never   Smokeless tobacco: Current    Types: Snuff  Substance and Sexual Activity   Alcohol use: Yes    Comment:  Once a month   Drug use: No   Sexual activity: Not on file  Other Topics Concern   Not on file  Social History Narrative   Not on file   Social Determinants of Health   Financial Resource Strain: Not on file  Food Insecurity: Not on file  Transportation Needs: Not on file  Physical Activity: Not on file  Stress: Not on file  Social Connections: Not on file  Intimate Partner Violence: Not on file     Review of Systems  All other systems reviewed and are negative.     Objective:   Physical Exam Vitals reviewed.  Constitutional:      General: He is not in acute distress.    Appearance: Normal appearance. He is obese. He is not ill-appearing or toxic-appearing.  HENT:     Head: Normocephalic and atraumatic.     Right Ear: Tympanic membrane and ear canal normal. There is no impacted cerumen.     Left Ear: Tympanic membrane and ear canal normal. There is no impacted cerumen.     Nose: Nose normal. No congestion or rhinorrhea.     Mouth/Throat:     Pharynx: No oropharyngeal exudate or posterior oropharyngeal erythema.  Eyes:     General:        Right eye: No discharge.        Left eye: No discharge.     Extraocular Movements: Extraocular movements intact.  Conjunctiva/sclera: Conjunctivae normal.     Pupils: Pupils are equal, round, and reactive to light.  Neck:     Vascular: No carotid bruit.  Cardiovascular:     Rate and Rhythm: Normal rate and regular rhythm.     Pulses: Normal pulses.     Heart sounds: Normal heart sounds. No murmur heard.   No friction rub. No gallop.  Pulmonary:     Effort: Pulmonary effort is normal. No respiratory distress.     Breath sounds: Normal breath sounds. No wheezing, rhonchi or rales.  Abdominal:     General: Abdomen is flat. Bowel sounds are normal. There is no distension.     Palpations: Abdomen is soft.     Tenderness: There is no abdominal tenderness. There is no guarding or rebound.  Musculoskeletal:     Cervical back:  Normal range of motion and neck supple. No rigidity or tenderness.     Lumbar back: Spasms and tenderness present. No bony tenderness. Decreased range of motion. Negative right straight leg raise test and negative left straight leg raise test.       Back:     Right lower leg: No edema.     Left lower leg: No edema.  Lymphadenopathy:     Cervical: No cervical adenopathy.  Skin:    General: Skin is warm.     Coloration: Skin is not jaundiced.     Findings: No bruising, erythema, lesion or rash.  Neurological:     General: No focal deficit present.     Mental Status: He is alert and oriented to person, place, and time. Mental status is at baseline.     Cranial Nerves: No cranial nerve deficit.     Sensory: No sensory deficit.     Motor: No weakness.     Coordination: Coordination normal.     Gait: Gait normal.     Deep Tendon Reflexes: Reflexes normal.  Psychiatric:        Mood and Affect: Mood normal.        Behavior: Behavior normal.        Thought Content: Thought content normal.        Judgment: Judgment normal.          Assessment & Plan:  Diabetes mellitus type 2 in nonobese (HCC) - Plan: Ambulatory referral to Ophthalmology, Lipid panel  Prostate cancer screening - Plan: PSA  Need for pneumococcal vaccination - Plan: Pneumococcal polysaccharide vaccine 23-valent greater than or equal to 2yo subcutaneous/IM  Benign essential HTN  Chronic left-sided low back pain with left-sided sciatica  General medical exam Blood pressure is elevated today.  Add hydrochlorothiazide 25 mg a day and recheck blood pressure in 2 weeks.  Patient received Pneumovax 23 today and also recommended the shingles vaccine.  COVID vaccination is up-to-date.  Left-sided sciatica due to L3 nerve irritation has improved.  He declines neurosurgery consultation at the present time.  Diabetes is well controlled with an A1c of 5.4.  Return fasting for a fasting lipid panel as well as a PSA to screen for  prostate cancer.  Goal LDL cholesterol is less than 100.  Colonoscopy is up-to-date.

## 2020-11-24 ENCOUNTER — Encounter: Payer: Self-pay | Admitting: *Deleted

## 2021-01-23 ENCOUNTER — Other Ambulatory Visit: Payer: Self-pay | Admitting: *Deleted

## 2021-01-23 DIAGNOSIS — E119 Type 2 diabetes mellitus without complications: Secondary | ICD-10-CM

## 2021-01-23 MED ORDER — METFORMIN HCL 500 MG PO TABS
ORAL_TABLET | ORAL | 2 refills | Status: DC
Start: 2021-01-23 — End: 2021-07-26

## 2021-01-23 MED ORDER — CETIRIZINE HCL 10 MG PO TABS: 10.0000 mg | ORAL_TABLET | Freq: Every day | ORAL | 1 refills | Status: AC

## 2021-01-23 MED ORDER — OMEPRAZOLE 20 MG PO CPDR: 20.0000 mg | DELAYED_RELEASE_CAPSULE | Freq: Every day | ORAL | 3 refills | Status: AC

## 2021-01-23 MED ORDER — HYDROCHLOROTHIAZIDE 25 MG PO TABS: 25.0000 mg | ORAL_TABLET | Freq: Every day | ORAL | 3 refills | Status: DC

## 2021-01-23 MED ORDER — LISINOPRIL 20 MG PO TABS: ORAL_TABLET | ORAL | 3 refills | Status: DC

## 2021-06-18 ENCOUNTER — Encounter: Payer: Self-pay | Admitting: Family Medicine

## 2021-06-18 ENCOUNTER — Ambulatory Visit: Payer: 59 | Admitting: Family Medicine

## 2021-06-18 ENCOUNTER — Other Ambulatory Visit: Payer: Self-pay

## 2021-06-18 VITALS — BP 158/98 | HR 90 | Temp 98.1°F | Resp 18 | Ht 70.0 in | Wt 247.0 lb

## 2021-06-18 DIAGNOSIS — G8929 Other chronic pain: Secondary | ICD-10-CM

## 2021-06-18 DIAGNOSIS — M5442 Lumbago with sciatica, left side: Secondary | ICD-10-CM | POA: Diagnosis not present

## 2021-06-18 DIAGNOSIS — M25562 Pain in left knee: Secondary | ICD-10-CM | POA: Diagnosis not present

## 2021-06-18 DIAGNOSIS — M5432 Sciatica, left side: Secondary | ICD-10-CM | POA: Diagnosis not present

## 2021-06-18 MED ORDER — MELOXICAM 15 MG PO TABS: 15.0000 mg | ORAL_TABLET | Freq: Every day | ORAL | 0 refills | Status: DC

## 2021-06-18 MED ORDER — GABAPENTIN 300 MG PO CAPS: 300.0000 mg | ORAL_CAPSULE | Freq: Three times a day (TID) | ORAL | 3 refills | Status: DC | PRN

## 2021-06-18 NOTE — Progress Notes (Signed)
Subjective:    Patient ID: Chad [name], male    DOB: June 22, 1960, 61 y.o.   MRN: [id number]  Had MRI in July 2022- T12-L1: No impingement.  Small central disc protrusion.   L1-2: Unremarkable.   L2-3: Borderline left foraminal stenosis due to left eccentric disc bulge and intervertebral spurring.   L3-4: Mild left foraminal stenosis and mild displacement of the left L3 nerve in the lateral extraforaminal space along with borderline bilateral subarticular lateral recess stenosis due to disc bulge, intervertebral spurring, and facet arthropathy.   L4-5: Moderate left and mild right subarticular lateral recess stenosis with mild bilateral foraminal stenosis due to disc bulge, intervertebral spurring, facet arthropathy, and small central disc protrusion.   L5-S1: Unremarkable. Incidental note is made of small Tarlov cysts at the S1-2 and S2 levels.  Continues to report pain in lower back.  Worse when sitting.  Reports burning stinging pain radiating down the left leg.  No better with alleve or advil.  Denies saddle anesthesia, bowel or bladder incontinence, or leg weakness.  In 1984, tore medial meniscus and possibly MCL in left knee.  Over last 30 years, has developed constant knee pain, instability and laxity going down steps.  Has daily pain unrelieved by NSAIDs.  Has tried and failed cortisone shots.   Past Medical History:  Diagnosis Date   Diabetes mellitus type 2 in nonobese [facility])    Erectile dysfunction    GERD (gastroesophageal reflux disease)    Sleep apnea    Past Surgical History:  Procedure Laterality Date   CERVICAL SPINE SURGERY  2002, 2004   COLONOSCOPY N/A 02/09/2014   Procedure: COLONOSCOPY;  Surgeon: Malissa Hippo, MD;  [facility];  Service: Endoscopy;  Laterality: N/A;  730 - rescheduled to 930 - Ann notified pt   Left knee arthroscopy     Current Outpatient Medications on File Prior to Visit  Medication Sig Dispense Refill    cetirizine (ZYRTEC) 10 MG tablet Take 1 tablet (10 mg total) by mouth daily. 90 tablet 1   hydrochlorothiazide (HYDRODIURIL) 25 MG tablet Take 1 tablet (25 mg total) by mouth daily. 90 tablet 3   lisinopril (ZESTRIL) 20 MG tablet TAKE 1 TABLET BY MOUTH DAILY. 90 tablet 3   metFORMIN (GLUCOPHAGE) 500 MG tablet TAKE 1 TABLET BY MOUTH 2 TIMES DAILY WITH A MEAL. 180 tablet 2   omeprazole (PRILOSEC) 20 MG capsule Take 1 capsule (20 mg total) by mouth daily. 90 capsule 3   sildenafil (VIAGRA) 100 MG tablet Take 1 tablet (100 mg total) by mouth daily as needed for erectile dysfunction. 30 tablet 2   No current facility-administered medications on file prior to visit.   No Known Allergies Social History   Socioeconomic History   Marital status: Divorced    Spouse name: Not on file   Number of children: Not on file   Years of education: Not on file   Highest education level: Not on file  Occupational History   Not on file  Tobacco Use   Smoking status: Never   Smokeless tobacco: Current    Types: Snuff  Substance and Sexual Activity   Alcohol use: Yes    Comment: Once a month   Drug use: No   Sexual activity: Not on file  Other Topics Concern   Not on file  Social History Narrative   Not on file   Social Determinants of Health   Financial Resource Strain: Not on file  Food  Insecurity: Not on file  Transportation Needs: Not on file  Physical Activity: Not on file  Stress: Not on file  Social Connections: Not on file  Intimate Partner Violence: Not on file     Review of Systems  All other systems reviewed and are negative.     Objective:   Physical Exam Vitals reviewed.  Cardiovascular:     Rate and Rhythm: Normal rate and regular rhythm.     Heart sounds: Normal heart sounds.  Pulmonary:     Effort: Pulmonary effort is normal.     Breath sounds: Normal breath sounds.  Musculoskeletal:     Lumbar back: Spasms and tenderness present. No bony  tenderness. Decreased range of motion. Negative right straight leg raise test and negative left straight leg raise test.       Back:     Left knee: Bony tenderness and crepitus present. Decreased range of motion. Tenderness present over the medial joint line and MCL.          Assessment & Plan:  Left sided sciatica - Plan: Ambulatory referral to Orthopedic Surgery  Chronic left-sided low back pain with left-sided sciatica  Left medial knee pain - Plan: Ambulatory referral to Orthopedic Surgery Consult Dr Ethelene Hal to consider cortisone injection at L4-5 for left sided sciatica. Meanwhile, try mobic 15 mg poqday and gabapentin 300 mg potid prn sciatica.  Consult Dr. Berton Lan for possible knee replacement.

## 2021-07-15 ENCOUNTER — Other Ambulatory Visit: Payer: Self-pay | Admitting: Family Medicine

## 2021-07-26 ENCOUNTER — Other Ambulatory Visit: Payer: Self-pay | Admitting: Family Medicine

## 2021-08-15 ENCOUNTER — Other Ambulatory Visit: Payer: Self-pay | Admitting: Family Medicine

## 2021-09-10 HISTORY — PX: OTHER SURGICAL HISTORY: SHX169

## 2021-09-10 HISTORY — PX: KNEE ARTHROPLASTY: SHX992

## 2021-09-16 ENCOUNTER — Other Ambulatory Visit: Payer: Self-pay | Admitting: Family Medicine

## 2021-09-17 NOTE — Telephone Encounter (Signed)
Requested Prescriptions  ?Pending Prescriptions Disp Refills  ?? meloxicam (MOBIC) 15 MG tablet [Pharmacy Med Name: MELOXICAM $RemoveBeforeD'15MG'[id number]$  TABLETS] 30 tablet 0  ?  Sig: TAKE 1 TABLET(15 MG) BY MOUTH DAILY  ?  ? Analgesics:  COX2 Inhibitors Failed - 09/16/2021  3:55 AM  ?  ?  Failed - Manual Review: Labs are only required if the patient has taken medication for more than 8 weeks.  ?  ?  Passed - HGB in normal range and within 360 days  ?  Hemoglobin  ?Date Value Ref Range Status  ?10/31/2020 15.0 13.2 - 17.1 g/dL Final  ?   ?  ?  Passed - Cr in normal range and within 360 days  ?  Creat  ?Date Value Ref Range Status  ?10/31/2020 0.85 0.70 - 1.25 mg/dL Final  ?  Comment:  ?  For patients >46 years of age, the reference limit ?for Creatinine is approximately 13% higher for people ?identified as African-American. ?. ?  ?   ?  ?  Passed - HCT in normal range and within 360 days  ?  HCT  ?Date Value Ref Range Status  ?10/31/2020 44.4 38.5 - 50.0 % Final  ?   ?  ?  Passed - AST in normal range and within 360 days  ?  AST  ?Date Value Ref Range Status  ?10/31/2020 24 10 - 35 U/L Final  ?   ?  ?  Passed - ALT in normal range and within 360 days  ?  ALT  ?Date Value Ref Range Status  ?10/31/2020 32 9 - 46 U/L Final  ?   ?  ?  Passed - eGFR is 30 or above and within 360 days  ?  GFR, Est African American  ?Date Value Ref Range Status  ?10/31/2020 110 > OR = 60 mL/min/1.89m2 Final  ? ?GFR, Est Non African American  ?Date Value Ref Range Status  ?10/31/2020 95 > OR = 60 mL/min/1.54m2 Final  ?   ?  ?  Passed - Patient is not pregnant  ?  ?  Passed - Valid encounter within last 12 months  ?  Recent Outpatient Visits   ?      ? 3 months ago Left sided sciatica  ? [facility] [place], Cammie Mcgee, MD  ? 10 months ago Diabetes mellitus type 2 in nonobese [facility])  ? [facility], Cammie Mcgee, MD  ? 10 months ago Diabetes mellitus type 2 in nonobese [facility])  ? [facility] Pickard, Cammie Mcgee, MD  ? 2  years ago Diabetes mellitus type 2 in nonobese [facility])  ? [facility] Pickard, Cammie Mcgee, MD  ? 2 years ago Diabetes mellitus type 2 in nonobese [facility])  ? [facility] Pickard, Cammie Mcgee, MD  ?  ?  ? ?  ?  ?  ? ?

## 2021-09-18 IMAGING — MR MR LUMBAR SPINE W/O CM
4 of 5 series · 26 of 48 positions shown · non-contrast
Comparison: 05/14/2006 radiographs

CLINICAL DATA: Left low back pain extending down the left leg over
the past 3 months.

EXAM:
MRI LUMBAR SPINE WITHOUT CONTRAST
TECHNIQUE: Multiplanar, multisequence MR imaging of the lumbar spine was
performed. No intravenous contrast was administered.

[Series 2: T2 · sagittal · 4.0mm · 0.53mm/px · 6 of 15 slices shown (1 of 2)]
[im 1/15]
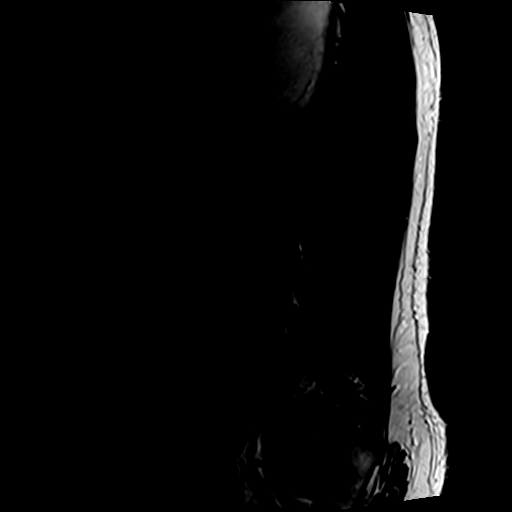
[im 3/15]
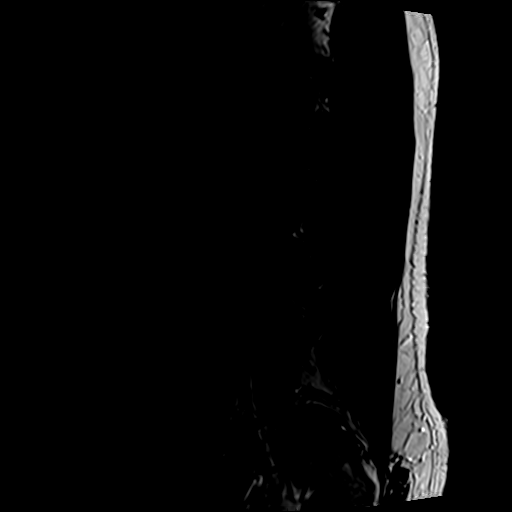
[im 6/15]
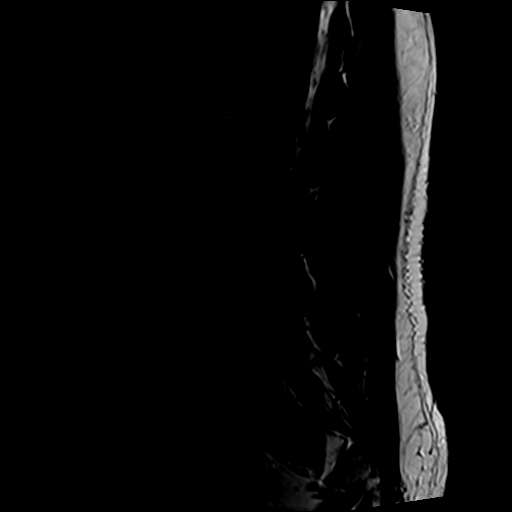
[im 9/15]
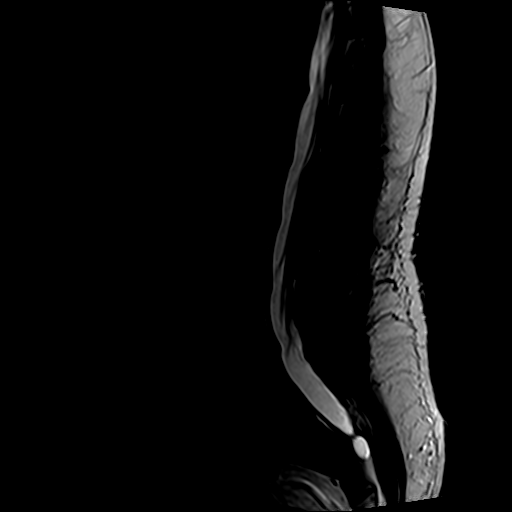
[im 12/15]
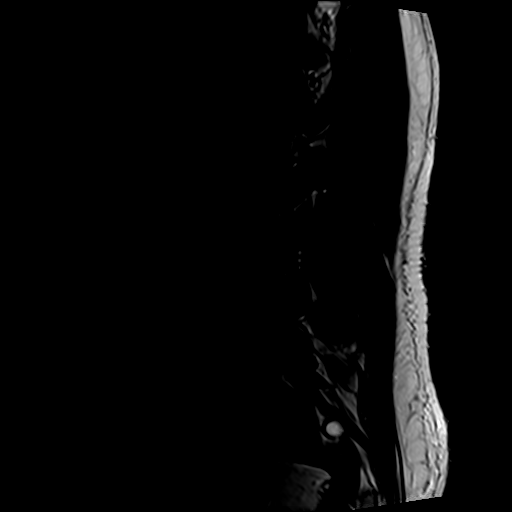
[im 15/15]
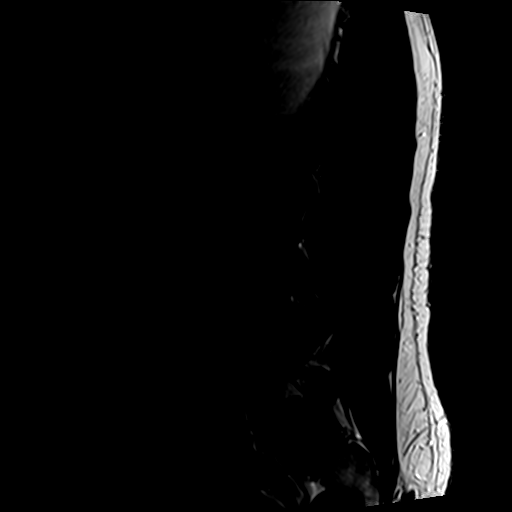

[Series 4: T1 · sagittal · 4.0mm · 0.53mm/px · 6 of 15 slices shown (1 of 2)]
[im 1/15]
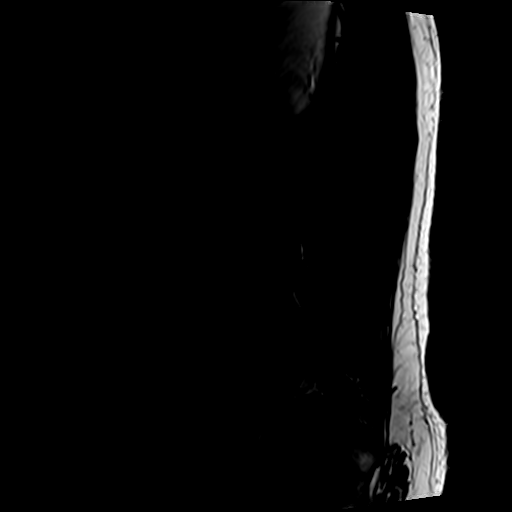
[im 3/15]
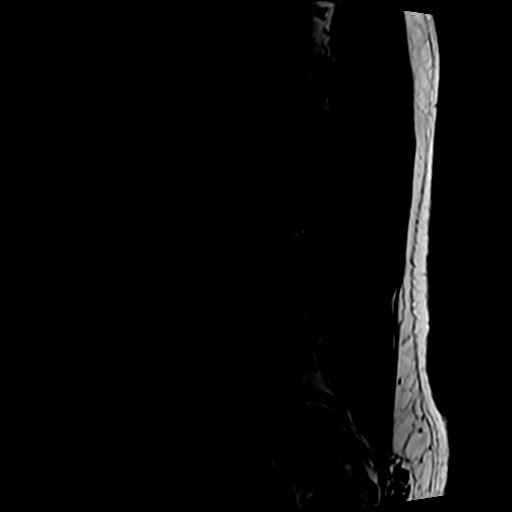
[im 6/15]
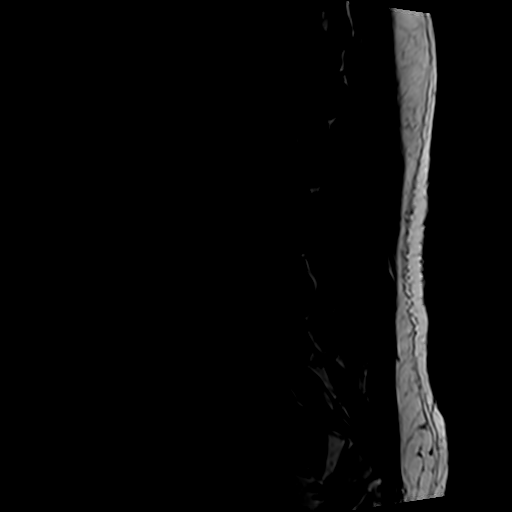
[im 9/15]
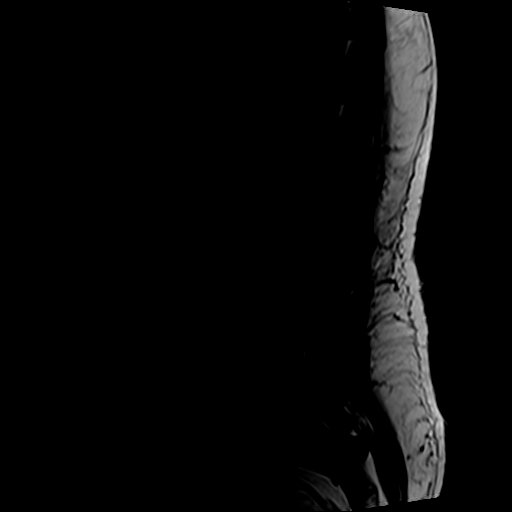
[im 12/15]
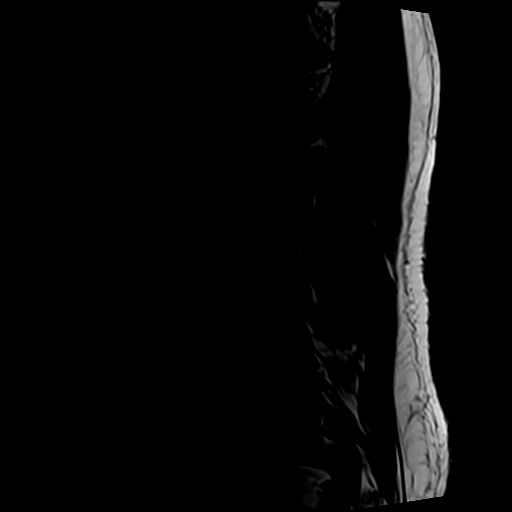
[im 15/15]
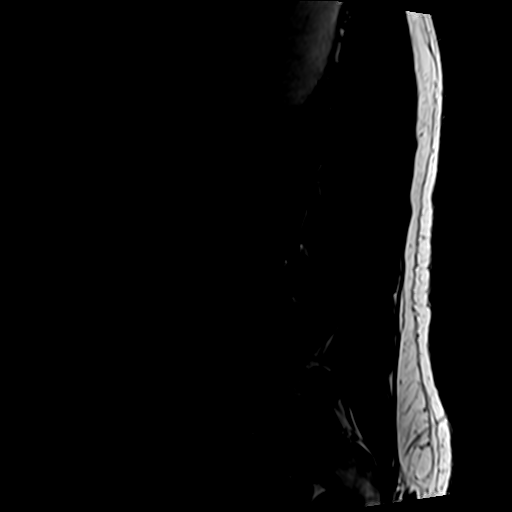

[Series 5: T2 · axial · 4.0mm · 0.70mm/px · z∈[-85,+124]mm · 9 of 38 slices shown (2 of 2)]
[im 1/38]
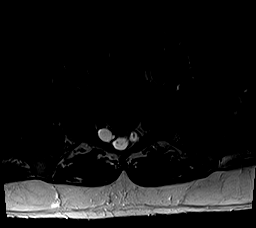
[im 6/38]
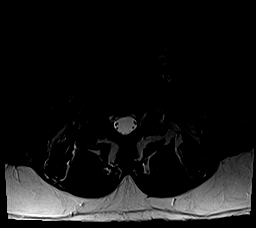
[im 11/38]
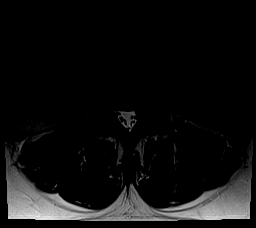
[im 16/38]
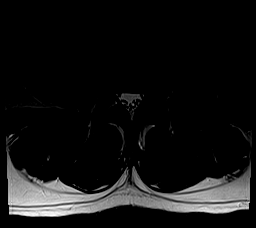
[im 19/38]
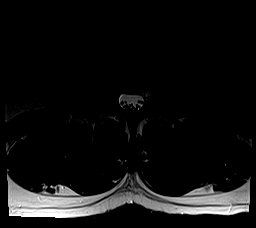
[im 22/38]
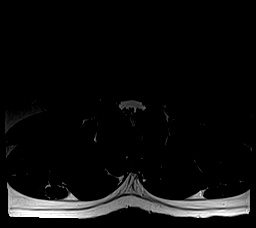
[im 27/38]
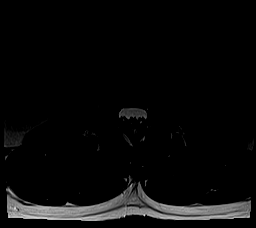
[im 32/38]
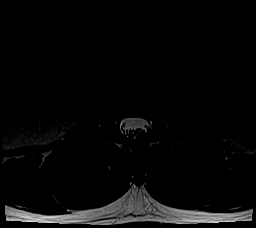
[im 38/38]
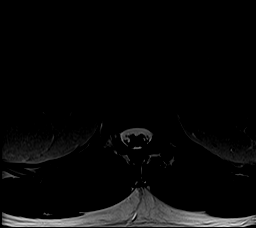

[Series 6: T1 · axial · 4.0mm · 0.35mm/px · z∈[-85,+93]mm · 5 of 38 slices shown (2 of 2)]
[im 1/38]
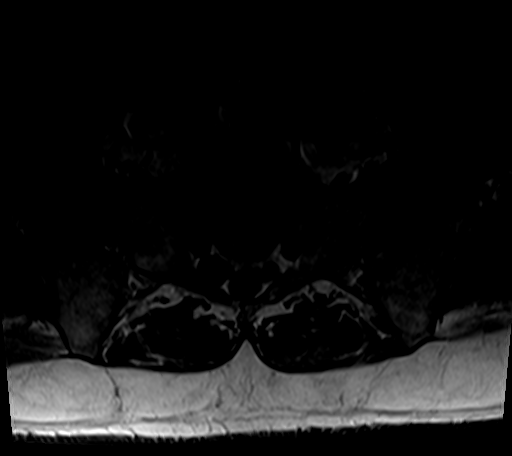
[im 6/38]
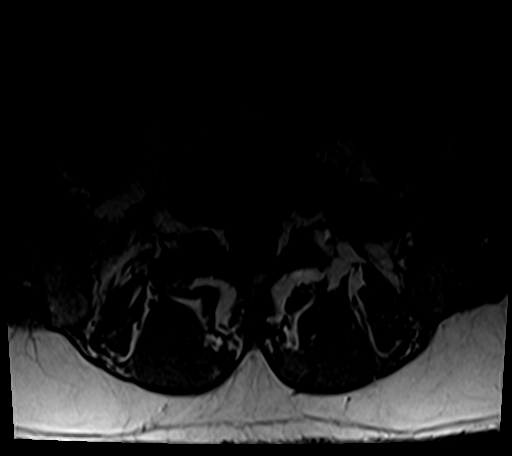
[im 11/38]
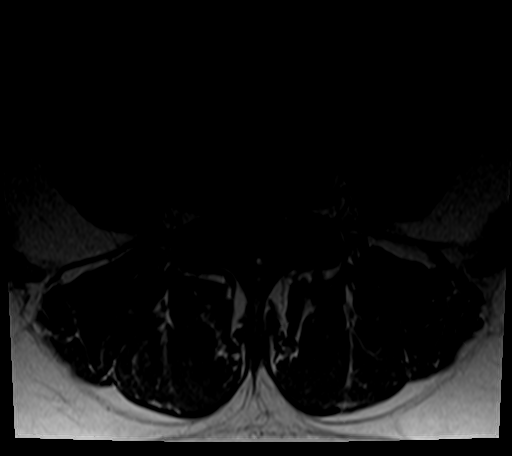
[im 19/38]
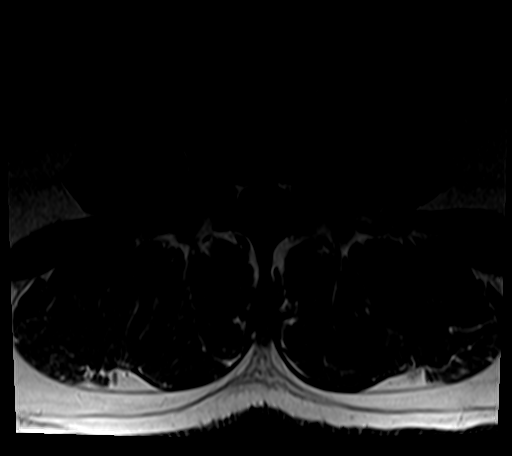
[im 32/38]
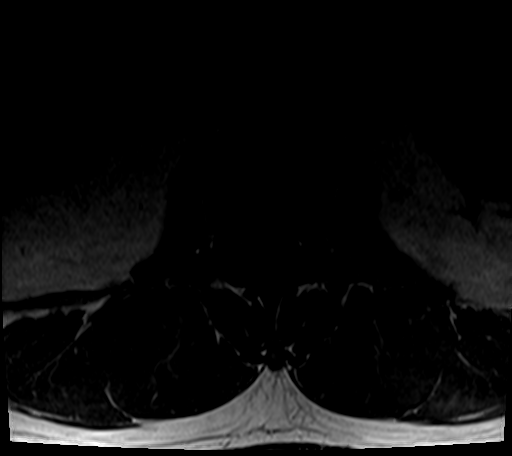

[26 of 48 positions shown; findings below may reference images not displayed]

FINDINGS: Segmentation: Slightly transitional L5 vertebra with a broad left
transverse process which pseudo articulates with the upper sacrum.

Alignment:  No vertebral subluxation is observed.

Vertebrae: Scattered small hemangiomas in the lumbar spine
suggesting mild hand hemangiomatosis. Disc desiccation at all levels
between L1 and L5.

Conus medullaris and cauda equina: Conus extends to the L1 level.
Conus and cauda equina appear normal.

Paraspinal and other soft tissues: Unremarkable

Disc levels:

T12-L1: No impingement.  Small central disc protrusion.

L1-2: Unremarkable.

L2-3: Borderline left foraminal stenosis due to left eccentric disc
bulge and intervertebral spurring.

L3-4: Mild left foraminal stenosis and mild displacement of the left
L3 nerve in the lateral extraforaminal space along with borderline
bilateral subarticular lateral recess stenosis due to disc bulge,
intervertebral spurring, and facet arthropathy.

L4-5: Moderate left and mild right subarticular lateral recess
stenosis with mild bilateral foraminal stenosis due to disc bulge,
intervertebral spurring, facet arthropathy, and small central disc
protrusion.

L5-S1: Unremarkable. Incidental note is made of small Tarlov cysts
at the S1-2 and S2 levels.
IMPRESSION: 1. Lumbar spondylosis and degenerative disc disease, causing
moderate impingement at L4-5 and mild impingement at L3-4, as noted
above.
2. Slightly transitional features of the L5 vertebra.

## 2021-10-17 ENCOUNTER — Other Ambulatory Visit: Payer: Self-pay | Admitting: Family Medicine

## 2021-10-17 NOTE — Telephone Encounter (Signed)
Requested Prescriptions  Pending Prescriptions Disp Refills  . meloxicam (MOBIC) 15 MG tablet [Pharmacy Med Name: MELOXICAM $RemoveBeforeD'15MG'[id number]$  TABLETS] 30 tablet 0    Sig: TAKE 1 TABLET(15 MG) BY MOUTH DAILY     Analgesics:  COX2 Inhibitors Failed - 10/17/2021  3:54 AM      Failed - Manual Review: Labs are only required if the patient has taken medication for more than 8 weeks.      Passed - HGB in normal range and within 360 days    Hemoglobin  Date Value Ref Range Status  10/31/2020 15.0 13.2 - 17.1 g/dL Final         Passed - Cr in normal range and within 360 days    Creat  Date Value Ref Range Status  10/31/2020 0.85 0.70 - 1.25 mg/dL Final    Comment:    For patients >64 years of age, the reference limit for Creatinine is approximately 13% higher for people identified as African-American. .          Passed - HCT in normal range and within 360 days    HCT  Date Value Ref Range Status  10/31/2020 44.4 38.5 - 50.0 % Final         Passed - AST in normal range and within 360 days    AST  Date Value Ref Range Status  10/31/2020 24 10 - 35 U/L Final         Passed - ALT in normal range and within 360 days    ALT  Date Value Ref Range Status  10/31/2020 32 9 - 46 U/L Final         Passed - eGFR is 30 or above and within 360 days    GFR, Est African American  Date Value Ref Range Status  10/31/2020 110 > OR = 60 mL/min/1.75m2 Final   GFR, Est Non African American  Date Value Ref Range Status  10/31/2020 95 > OR = 60 mL/min/1.27m2 Final         Passed - Patient is not pregnant      Passed - Valid encounter within last 12 months    Recent Outpatient Visits          4 months ago Left sided sciatica   [place] Susy Frizzle, MD   11 months ago Diabetes mellitus type 2 in nonobese [facility])   [place] Susy Frizzle, [place]   11 months ago Diabetes mellitus type 2 in nonobese [facility])   [place] Susy Frizzle, MD   2  years ago Diabetes mellitus type 2 in nonobese [facility])   [place] Susy Frizzle, MD   2 years ago Diabetes mellitus type 2 in nonobese [facility])   [facility] [place], Cammie Mcgee, MD

## 2021-10-23 ENCOUNTER — Other Ambulatory Visit: Payer: Self-pay | Admitting: Family Medicine

## 2021-10-23 DIAGNOSIS — E119 Type 2 diabetes mellitus without complications: Secondary | ICD-10-CM

## 2021-10-23 NOTE — Telephone Encounter (Signed)
Medication refilled 01/23/2021 #90 3 refills - 1 year supply. Requested Prescriptions  Pending Prescriptions Disp Refills  . lisinopril (ZESTRIL) 20 MG tablet [Pharmacy Med Name: LISINOPRIL 20MG  TABLETS] 90 tablet 3    Sig: TAKE 1 TABLET BY MOUTH EVERY DAY     There is no refill protocol information for this order

## 2021-11-07 ENCOUNTER — Telehealth: Payer: Self-pay

## 2021-11-07 ENCOUNTER — Other Ambulatory Visit: Payer: Self-pay

## 2021-11-07 DIAGNOSIS — E119 Type 2 diabetes mellitus without complications: Secondary | ICD-10-CM

## 2021-11-07 MED ORDER — LISINOPRIL 20 MG PO TABS: ORAL_TABLET | ORAL | 0 refills | Status: DC

## 2021-11-07 MED ORDER — METFORMIN HCL 500 MG PO TABS: ORAL_TABLET | ORAL | 0 refills | Status: DC

## 2021-11-07 NOTE — Telephone Encounter (Signed)
Pt called asking for refills, however, did not leave the name of meds.   Call pt to find out what meds pt need refill, however no answer. LM for return call

## 2021-11-13 ENCOUNTER — Other Ambulatory Visit: Payer: Self-pay | Admitting: Family Medicine

## 2021-11-20 ENCOUNTER — Other Ambulatory Visit: Payer: Self-pay | Admitting: Family Medicine

## 2021-11-20 DIAGNOSIS — E119 Type 2 diabetes mellitus without complications: Secondary | ICD-10-CM

## 2021-11-21 NOTE — Telephone Encounter (Signed)
Requested medication (s) are due for refill today: yes  Requested medication (s) are on the active medication list: yes  Last refill:  11/07/21  Future visit scheduled: yes  Notes to clinic:  Unable to refill per protocol, courtesy refill already given, routing for provider approval.      Requested Prescriptions  Pending Prescriptions Disp Refills   metFORMIN (GLUCOPHAGE) 500 MG tablet [Pharmacy Med Name: METFORMIN $RemoveBeforeD'500MG'[id number]$  TABLETS] 30 tablet 0    Sig: TAKE 1 TABLET BY MOUTH TWICE DAILY WITH A MEAL     Endocrinology:  Diabetes - Biguanides Failed - 11/20/2021  8:34 AM      Failed - Cr in normal range and within 360 days    Creat  Date Value Ref Range Status  10/31/2020 0.85 0.70 - 1.25 mg/dL Final    Comment:    For patients >69 years of age, the reference limit for Creatinine is approximately 13% higher for people identified as African-American. .          Failed - HBA1C is between 0 and 7.9 and within 180 days    Hgb A1c MFr Bld  Date Value Ref Range Status  10/31/2020 5.4 <5.7 % of total Hgb Final    Comment:    For the purpose of screening for the presence of diabetes: . <5.7%       Consistent with the absence of diabetes 5.7-6.4%    Consistent with increased risk for diabetes             (prediabetes) > or =6.5%  Consistent with diabetes . This assay result is consistent with a decreased risk of diabetes. . Currently, no consensus exists regarding use of hemoglobin A1c for diagnosis of diabetes in children. . According to American Diabetes Association (ADA) guidelines, hemoglobin A1c <7.0% represents optimal control in non-pregnant diabetic patients. Different metrics may apply to specific patient populations.  Standards of Medical Care in Diabetes(ADA). .          Failed - eGFR in normal range and within 360 days    GFR, Est African American  Date Value Ref Range Status  10/31/2020 110 > OR = 60 mL/min/1.79m2 Final   GFR, Est Non African American   Date Value Ref Range Status  10/31/2020 95 > OR = 60 mL/min/1.82m2 Final         Failed - B12 Level in normal range and within 720 days    No results found for: "VITAMINB12"       Failed - CBC within normal limits and completed in the last 12 months    WBC  Date Value Ref Range Status  10/31/2020 9.0 3.8 - 10.8 Thousand/uL Final   RBC  Date Value Ref Range Status  10/31/2020 4.76 4.20 - 5.80 Million/uL Final   Hemoglobin  Date Value Ref Range Status  10/31/2020 15.0 13.2 - 17.1 g/dL Final   HCT  Date Value Ref Range Status  10/31/2020 44.4 38.5 - 50.0 % Final   MCHC  Date Value Ref Range Status  10/31/2020 33.8 32.0 - 36.0 g/dL Final   [facility]  Date Value Ref Range Status  10/31/2020 31.5 27.0 - 33.0 pg Final   MCV  Date Value Ref Range Status  10/31/2020 93.3 80.0 - 100.0 fL Final   No results found for: "PLTCOUNTKUC", "LABPLAT", "POCPLA" RDW  Date Value Ref Range Status  10/31/2020 12.4 11.0 - 15.0 % Final         Passed - Valid encounter within last  6 months    Recent Outpatient Visits           5 months ago Left sided sciatica   [place] Susy Frizzle, MD   1 year ago Diabetes mellitus type 2 in nonobese [facility])   [place] Susy Frizzle, MD   1 year ago Diabetes mellitus type 2 in nonobese [facility])   [place] Susy Frizzle, MD   2 years ago Diabetes mellitus type 2 in nonobese [facility])   [place] Susy Frizzle, MD   2 years ago Diabetes mellitus type 2 in nonobese [facility])   [place] Pickard, Cammie Mcgee, MD       Future Appointments             In 2 weeks Pickard, Cammie Mcgee, MD [place]

## 2021-12-11 ENCOUNTER — Other Ambulatory Visit: Payer: Self-pay | Admitting: Family Medicine

## 2021-12-11 ENCOUNTER — Encounter: Payer: 59 | Admitting: Family Medicine

## 2021-12-11 DIAGNOSIS — E119 Type 2 diabetes mellitus without complications: Secondary | ICD-10-CM

## 2021-12-16 ENCOUNTER — Other Ambulatory Visit: Payer: Self-pay | Admitting: Family Medicine

## 2021-12-17 NOTE — Telephone Encounter (Signed)
Requested medication (s) are due for refill today: yes  Requested medication (s) are on the active medication list: yes  Last refill:  11/14/21 #30/0  Future visit scheduled: no  Notes to clinic: Unable to refill per protocol due to failed labs, no updated results.     Requested Prescriptions  Pending Prescriptions Disp Refills   meloxicam (MOBIC) 15 MG tablet [Pharmacy Med Name: MELOXICAM $RemoveBeforeD'15MG'[id number]$  TABLETS] 30 tablet 0    Sig: TAKE 1 TABLET(15 MG) BY MOUTH DAILY     Analgesics:  COX2 Inhibitors Failed - 12/16/2021  3:55 AM      Failed - Manual Review: Labs are only required if the patient has taken medication for more than 8 weeks.      Failed - HGB in normal range and within 360 days    Hemoglobin  Date Value Ref Range Status  10/31/2020 15.0 13.2 - 17.1 g/dL Final         Failed - Cr in normal range and within 360 days    Creat  Date Value Ref Range Status  10/31/2020 0.85 0.70 - 1.25 mg/dL Final    Comment:    For patients >29 years of age, the reference limit for Creatinine is approximately 13% higher for people identified as African-American. .          Failed - HCT in normal range and within 360 days    HCT  Date Value Ref Range Status  10/31/2020 44.4 38.5 - 50.0 % Final         Failed - AST in normal range and within 360 days    AST  Date Value Ref Range Status  10/31/2020 24 10 - 35 U/L Final         Failed - ALT in normal range and within 360 days    ALT  Date Value Ref Range Status  10/31/2020 32 9 - 46 U/L Final         Failed - eGFR is 30 or above and within 360 days    GFR, Est African American  Date Value Ref Range Status  10/31/2020 110 > OR = 60 mL/min/1.4m2 Final   GFR, Est Non African American  Date Value Ref Range Status  10/31/2020 95 > OR = 60 mL/min/1.30m2 Final         Passed - Patient is not pregnant      Passed - Valid encounter within last 12 months    Recent Outpatient Visits           6 months ago Left sided sciatica    [place] Susy Frizzle, MD   1 year ago Diabetes mellitus type 2 in nonobese [facility])   [place] Susy Frizzle, MD   1 year ago Diabetes mellitus type 2 in nonobese [facility])   [place] Susy Frizzle, MD   2 years ago Diabetes mellitus type 2 in nonobese [facility])   [place] Susy Frizzle, MD   2 years ago Diabetes mellitus type 2 in nonobese [facility])   [facility] Pickard, Cammie Mcgee, MD

## 2022-01-17 ENCOUNTER — Other Ambulatory Visit: Payer: 59

## 2022-01-17 DIAGNOSIS — E782 Mixed hyperlipidemia: Secondary | ICD-10-CM

## 2022-01-17 DIAGNOSIS — I10 Essential (primary) hypertension: Secondary | ICD-10-CM

## 2022-01-17 DIAGNOSIS — Z125 Encounter for screening for malignant neoplasm of prostate: Secondary | ICD-10-CM

## 2022-01-17 DIAGNOSIS — E119 Type 2 diabetes mellitus without complications: Secondary | ICD-10-CM

## 2022-01-18 LAB — LIPID PANEL
Cholesterol: 189 mg/dL (ref <200)
HDL: 32 mg/dL — ABNORMAL LOW (ref >=40)
LDL Cholesterol (Calc): 119 mg/dL (calc) — ABNORMAL HIGH
Non-HDL Cholesterol (Calc): 157 mg/dL (calc) — ABNORMAL HIGH (ref <130)
Total CHOL/HDL Ratio: 5.9 (calc) — ABNORMAL HIGH (ref <5.0)
Triglycerides: 264 mg/dL — ABNORMAL HIGH (ref <150)

## 2022-01-18 LAB — CBC WITH DIFFERENTIAL/PLATELET
Absolute Monocytes: 574 cells/uL (ref 200–950)
Basophils Absolute: 70 cells/uL (ref 0–200)
Basophils Relative: 1 %
Eosinophils Absolute: 210 cells/uL (ref 15–500)
Eosinophils Relative: 3 %
HCT: 44.3 % (ref 38.5–50.0)
Hemoglobin: 15.2 g/dL (ref 13.2–17.1)
Lymphs Abs: 2268 cells/uL (ref 850–3900)
MCH: 30.8 pg (ref 27.0–33.0)
MCHC: 34.3 g/dL (ref 32.0–36.0)
MCV: 89.9 fL (ref 80.0–100.0)
MPV: 11.8 fL (ref 7.5–12.5)
Monocytes Relative: 8.2 %
Neutro Abs: 3878 cells/uL (ref 1500–7800)
Neutrophils Relative %: 55.4 %
Platelets: 221 10*3/uL (ref 140–400)
RBC: 4.93 10*6/uL (ref 4.20–5.80)
RDW: 12.6 % (ref 11.0–15.0)
Total Lymphocyte: 32.4 %
WBC: 7 10*3/uL (ref 3.8–10.8)

## 2022-01-18 LAB — COMPREHENSIVE METABOLIC PANEL
AG Ratio: 1.6 (calc) (ref 1.0–2.5)
ALT: 28 U/L (ref 9–46)
AST: 18 U/L (ref 10–35)
Albumin: 4.4 g/dL (ref 3.6–5.1)
Alkaline phosphatase (APISO): 62 U/L (ref 35–144)
BUN: 18 mg/dL (ref 7–25)
CO2: 31 mmol/L (ref 20–32)
Calcium: 9.5 mg/dL (ref 8.6–10.3)
Chloride: 100 mmol/L (ref 98–110)
Creat: 1.15 mg/dL (ref 0.70–1.35)
Globulin: 2.7 g/dL (calc) (ref 1.9–3.7)
Glucose, Bld: 144 mg/dL — ABNORMAL HIGH (ref 65–99)
Potassium: 4.5 mmol/L (ref 3.5–5.3)
Sodium: 139 mmol/L (ref 135–146)
Total Bilirubin: 0.7 mg/dL (ref 0.2–1.2)
Total Protein: 7.1 g/dL (ref 6.1–8.1)

## 2022-01-18 LAB — HEMOGLOBIN A1C
Hgb A1c MFr Bld: 6.8 % of total Hgb — ABNORMAL HIGH (ref <5.7)
Mean Plasma Glucose: 148 mg/dL
eAG (mmol/L): 8.2 mmol/L

## 2022-01-18 LAB — PSA: PSA: 0.74 ng/mL (ref <=4.00)

## 2022-01-24 ENCOUNTER — Encounter: Payer: Self-pay | Admitting: Family Medicine

## 2022-01-24 ENCOUNTER — Ambulatory Visit (INDEPENDENT_AMBULATORY_CARE_PROVIDER_SITE_OTHER): Payer: 59 | Admitting: Family Medicine

## 2022-01-24 VITALS — BP 126/82 | HR 84 | Temp 98.4°F | Ht 70.0 in | Wt 239.6 lb

## 2022-01-24 DIAGNOSIS — Z23 Encounter for immunization: Secondary | ICD-10-CM | POA: Diagnosis not present

## 2022-01-24 DIAGNOSIS — Z125 Encounter for screening for malignant neoplasm of prostate: Secondary | ICD-10-CM

## 2022-01-24 DIAGNOSIS — E119 Type 2 diabetes mellitus without complications: Secondary | ICD-10-CM | POA: Diagnosis not present

## 2022-01-24 DIAGNOSIS — Z0000 Encounter for general adult medical examination without abnormal findings: Secondary | ICD-10-CM | POA: Diagnosis not present

## 2022-01-24 DIAGNOSIS — M5432 Sciatica, left side: Secondary | ICD-10-CM | POA: Diagnosis not present

## 2022-01-24 DIAGNOSIS — I10 Essential (primary) hypertension: Secondary | ICD-10-CM | POA: Diagnosis not present

## 2022-01-24 MED ORDER — METFORMIN HCL 500 MG PO TABS: 500.0000 mg | ORAL_TABLET | Freq: Two times a day (BID) | ORAL | 3 refills | Status: DC

## 2022-01-24 NOTE — Addendum Note (Signed)
Addended by: Venia Carbon K on: 01/24/2022 11:22 AM   Modules accepted: Orders

## 2022-01-24 NOTE — Progress Notes (Signed)
Subjective:    Patient ID: Chad [name], male    DOB: 08/26/1960, 61 y.o.   MRN: [id number]  Patient is here today for complete physical exam.  His last colonoscopy was in 2015.  At that point he was given a clear bill of health for 10 years.  He is due again in 2025.  He is due for a PSA to screen for prostate cancer. Patient has a bulging disc between L3 and L4 abutting the left L3 nerve root and displacing it.  This was also causing foraminal stenosis on the left-hand side.  I believe that this was what was causing his left-sided sciatica.  Patient has not been having sciatica recently.  However he has been less active because he had his left knee replaced.  He has been out of metformin for the last 2 months.  His A1c rose to 6.8.  He is not currently on a statin.  His LDL cholesterol 119.  We discussed the statin but he declines a statin at the present time.  He is due for a flu shot.  He is due for COVID booster.  He is due for the shingles vaccine although he does have a previous history of shingles.  His creatinine did rise slightly from 0.9-1.15.  He has been taking meloxicam daily despite not having any pain.  I recommended that he cut back on the meloxicam Past Medical History:  Diagnosis Date   Diabetes mellitus type 2 in nonobese [facility])    Erectile dysfunction    GERD (gastroesophageal reflux disease)    Sleep apnea    Past Surgical History:  Procedure Laterality Date   CERVICAL SPINE SURGERY  2002, 2004   COLONOSCOPY N/A 02/09/2014   Procedure: COLONOSCOPY;  Surgeon: Malissa Hippo, MD;  [facility];  Service: Endoscopy;  Laterality: N/A;  730 - rescheduled to 930 - Ann notified pt   Left knee arthroscopy Left 09/2021   Current Outpatient Medications on File Prior to Visit  Medication Sig Dispense Refill   cetirizine (ZYRTEC) 10 MG tablet Take 1 tablet (10 mg total) by mouth daily. 90 tablet 1   gabapentin (NEURONTIN) 300 MG capsule Take 1 capsule (300 mg total) by  mouth 3 (three) times daily as needed (nerve pain). 90 capsule 3   hydrochlorothiazide (HYDRODIURIL) 25 MG tablet Take 1 tablet (25 mg total) by mouth daily. 90 tablet 3   lisinopril (ZESTRIL) 20 MG tablet TAKE 1 TABLET BY MOUTH DAILY. 30 tablet 0   meloxicam (MOBIC) 15 MG tablet TAKE 1 TABLET(15 MG) BY MOUTH DAILY 30 tablet 1   metFORMIN (GLUCOPHAGE) 500 MG tablet TAKE 1 TABLET BY MOUTH TWICE DAILY WITH A MEAL 30 tablet 0   omeprazole (PRILOSEC) 20 MG capsule Take 1 capsule (20 mg total) by mouth daily. 90 capsule 3   sildenafil (VIAGRA) 100 MG tablet Take 1 tablet (100 mg total) by mouth daily as needed for erectile dysfunction. 30 tablet 2   No current facility-administered medications on file prior to visit.   No Known Allergies Social History   Socioeconomic History   Marital status: Divorced    Spouse name: Not on file   Number of children: Not on file   Years of education: Not on file   Highest education level: Not on file  Occupational History   Not on file  Tobacco Use   Smoking status: Never   Smokeless tobacco: Current    Types: Snuff  Substance and Sexual  Activity   Alcohol use: Yes    Comment: Once a month   Drug use: No   Sexual activity: Not on file  Other Topics Concern   Not on file  Social History Narrative   Not on file   Social Determinants of Health   Financial Resource Strain: Not on file  Food Insecurity: Not on file  Transportation Needs: Not on file  Physical Activity: Not on file  Stress: Not on file  Social Connections: Not on file  Intimate Partner Violence: Not on file     Review of Systems  All other systems reviewed and are negative.      Objective:   Physical Exam Vitals reviewed.  Constitutional:      General: He is not in acute distress.    Appearance: Normal appearance. He is obese. He is not ill-appearing or toxic-appearing.  HENT:     Head: Normocephalic and atraumatic.     Right Ear: Tympanic membrane and ear canal  normal. There is no impacted cerumen.     Left Ear: Tympanic membrane and ear canal normal. There is no impacted cerumen.     Nose: Nose normal. No congestion or rhinorrhea.     Mouth/Throat:     Pharynx: No oropharyngeal exudate or posterior oropharyngeal erythema.  Eyes:     General:        Right eye: No discharge.        Left eye: No discharge.     Extraocular Movements: Extraocular movements intact.     Conjunctiva/sclera: Conjunctivae normal.     Pupils: Pupils are equal, round, and reactive to light.  Neck:     Vascular: No carotid bruit.  Cardiovascular:     Rate and Rhythm: Normal rate and regular rhythm.     Pulses: Normal pulses.     Heart sounds: Normal heart sounds. No murmur heard.    No friction rub. No gallop.  Pulmonary:     Effort: Pulmonary effort is normal. No respiratory distress.     Breath sounds: Normal breath sounds. No wheezing, rhonchi or rales.  Abdominal:     General: Abdomen is flat. Bowel sounds are normal. There is no distension.     Palpations: Abdomen is soft.     Tenderness: There is no abdominal tenderness. There is no guarding or rebound.  Musculoskeletal:     Cervical back: Normal range of motion and neck supple.     Right lower leg: No edema.     Left lower leg: No edema.  Lymphadenopathy:     Cervical: No cervical adenopathy.  Skin:    General: Skin is warm.     Coloration: Skin is not jaundiced.     Findings: No bruising, erythema, lesion or rash.  Neurological:     General: No focal deficit present.     Mental Status: He is alert and oriented to person, place, and time. Mental status is at baseline.     Cranial Nerves: No cranial nerve deficit.     Sensory: No sensory deficit.     Motor: No weakness.     Coordination: Coordination normal.     Gait: Gait normal.     Deep Tendon Reflexes: Reflexes normal.  Psychiatric:        Mood and Affect: Mood normal.        Behavior: Behavior normal.        Thought Content: Thought content  normal.        Judgment: Judgment normal.  Assessment & Plan:  General medical exam  Benign essential HTN  Diabetes mellitus type 2 in nonobese (HCC) - Plan: metFORMIN (GLUCOPHAGE) 500 MG tablet  Left sided sciatica  Prostate cancer screening Patient received his flu shot today.  His blood pressure is excellent.  Resume metformin 500 mg twice daily for diabetes.  I recommended a statin but he politely declined.  Instead he wants to work on diet and exercise and weight loss recheck lab work in 6 months.  Recommended shingles vaccine as well as a COVID booster.  Colonoscopy is due in 2025.  PSA is excellent.  Diabetic foot exam was performed today and was normal.

## 2022-02-13 ENCOUNTER — Other Ambulatory Visit: Payer: Self-pay | Admitting: Family Medicine

## 2022-02-13 DIAGNOSIS — E119 Type 2 diabetes mellitus without complications: Secondary | ICD-10-CM

## 2022-02-13 NOTE — Telephone Encounter (Signed)
Requested Prescriptions  Pending Prescriptions Disp Refills  . lisinopril (ZESTRIL) 20 MG tablet [Pharmacy Med Name: LISINOPRIL 20MG  TABLETS] 90 tablet 1    Sig: TAKE 1 TABLET BY MOUTH DAILY     Cardiovascular:  ACE Inhibitors Failed - 02/13/2022  8:03 AM      Failed - Valid encounter within last 6 months    Recent Outpatient Visits          8 months ago Left sided sciatica   [place] Susy Frizzle, MD   1 year ago Diabetes mellitus type 2 in nonobese [facility])   [place] Susy Frizzle, MD   1 year ago Diabetes mellitus type 2 in nonobese [facility])   [place] Susy Frizzle, MD   2 years ago Diabetes mellitus type 2 in nonobese [facility])   [place] Susy Frizzle, MD   2 years ago Diabetes mellitus type 2 in nonobese [facility])   [place] Susy Frizzle, MD      Future Appointments            In 5 months Pickard, Cammie Mcgee, MD [place]           Passed - Cr in normal range and within 180 days    Creat  Date Value Ref Range Status  01/17/2022 1.15 0.70 - 1.35 mg/dL Final         Passed - K in normal range and within 180 days    Potassium  Date Value Ref Range Status  01/17/2022 4.5 3.5 - 5.3 mmol/L Final         Passed - Patient is not pregnant      Passed - Last BP in normal range    BP Readings from Last 1 Encounters:  01/24/22 126/82

## 2022-02-14 ENCOUNTER — Other Ambulatory Visit: Payer: Self-pay | Admitting: Family Medicine

## 2022-02-14 LAB — HM DIABETES EYE EXAM

## 2022-02-14 NOTE — Telephone Encounter (Signed)
Patient had OV 01/24/22, will refill medication.  Requested Prescriptions  Pending Prescriptions Disp Refills  . hydrochlorothiazide (HYDRODIURIL) 25 MG tablet [Pharmacy Med Name: HYDROCHLOROTHIAZIDE 25MG  TABLETS] 90 tablet 3    Sig: TAKE 1 TABLET(25 MG) BY MOUTH DAILY     Cardiovascular: Diuretics - Thiazide Failed - 02/14/2022  3:55 AM      Failed - Valid encounter within last 6 months    Recent Outpatient Visits          8 months ago Left sided sciatica   [place] Susy Frizzle, MD   1 year ago Diabetes mellitus type 2 in nonobese [facility])   [place] Susy Frizzle, MD   1 year ago Diabetes mellitus type 2 in nonobese [facility])   [place] Susy Frizzle, MD   2 years ago Diabetes mellitus type 2 in nonobese [facility])   [place] Susy Frizzle, MD   2 years ago Diabetes mellitus type 2 in nonobese [facility])   [place] Pickard, Cammie Mcgee, MD      Future Appointments            In 5 months Pickard, Cammie Mcgee, MD [place]           Passed - Cr in normal range and within 180 days    Creat  Date Value Ref Range Status  01/17/2022 1.15 0.70 - 1.35 mg/dL Final         Passed - K in normal range and within 180 days    Potassium  Date Value Ref Range Status  01/17/2022 4.5 3.5 - 5.3 mmol/L Final         Passed - Na in normal range and within 180 days    Sodium  Date Value Ref Range Status  01/17/2022 139 135 - 146 mmol/L Final         Passed - Last BP in normal range    BP Readings from Last 1 Encounters:  01/24/22 126/82

## 2022-05-15 ENCOUNTER — Other Ambulatory Visit: Payer: Self-pay | Admitting: Family Medicine

## 2022-05-30 ENCOUNTER — Encounter: Payer: Self-pay | Admitting: Family Medicine

## 2022-06-15 ENCOUNTER — Other Ambulatory Visit: Payer: Self-pay | Admitting: Family Medicine

## 2022-06-15 DIAGNOSIS — E119 Type 2 diabetes mellitus without complications: Secondary | ICD-10-CM

## 2022-07-25 ENCOUNTER — Ambulatory Visit (INDEPENDENT_AMBULATORY_CARE_PROVIDER_SITE_OTHER): Payer: 59 | Admitting: Family Medicine

## 2022-07-25 ENCOUNTER — Encounter: Payer: Self-pay | Admitting: Family Medicine

## 2022-07-25 VITALS — BP 132/80 | HR 77 | Temp 98.4°F | Ht 70.0 in | Wt 236.6 lb

## 2022-07-25 DIAGNOSIS — E119 Type 2 diabetes mellitus without complications: Secondary | ICD-10-CM | POA: Diagnosis not present

## 2022-07-25 DIAGNOSIS — I10 Essential (primary) hypertension: Secondary | ICD-10-CM | POA: Diagnosis not present

## 2022-07-25 MED ORDER — SILDENAFIL CITRATE 100 MG PO TABS: 100.0000 mg | ORAL_TABLET | Freq: Every day | ORAL | 2 refills | Status: AC | PRN

## 2022-07-25 NOTE — Progress Notes (Signed)
Subjective:    Patient ID: Chad [name], male    DOB: 1960/08/20, 62 y.o.   MRN: [id number]  Patient is here today for follow-up of his diabetes.  He is not currently checking his sugars.  He denies any polyuria, polydipsia, or blurry vision.  His blood pressure today is excellent.  He denies any chest pain shortness of breath or dyspnea on exertion.  He denies any neuropathy in his feet.  Diabetic foot exam was performed today and is normal.  He is compliant taking his metformin but he admits that he is been eating as he desires.  He also recently retired Past Medical History:  Diagnosis Date   Diabetes mellitus type 2 in nonobese [facility])    Erectile dysfunction    GERD (gastroesophageal reflux disease)    Sleep apnea    Past Surgical History:  Procedure Laterality Date   CERVICAL SPINE SURGERY  2002, 2004   COLONOSCOPY N/A 02/09/2014   Procedure: COLONOSCOPY;  Surgeon: Rogene Houston, MD;  [facility];  Service: Endoscopy;  Laterality: N/A;  730 - rescheduled to 930 - Ann notified pt   Left knee arthroscopy Left 09/2021   Current Outpatient Medications on File Prior to Visit  Medication Sig Dispense Refill   lisinopril (ZESTRIL) 20 MG tablet TAKE 1 TABLET BY MOUTH DAILY 90 tablet 1   cetirizine (ZYRTEC) 10 MG tablet Take 1 tablet (10 mg total) by mouth daily. 90 tablet 1   gabapentin (NEURONTIN) 300 MG capsule Take 1 capsule (300 mg total) by mouth 3 (three) times daily as needed (nerve pain). 90 capsule 3   hydrochlorothiazide (HYDRODIURIL) 25 MG tablet TAKE 1 TABLET(25 MG) BY MOUTH DAILY 90 tablet 0   meloxicam (MOBIC) 15 MG tablet TAKE 1 TABLET(15 MG) BY MOUTH DAILY 30 tablet 1   metFORMIN (GLUCOPHAGE) 500 MG tablet Take 1 tablet (500 mg total) by mouth 2 (two) times daily with a meal. 180 tablet 3   omeprazole (PRILOSEC) 20 MG capsule Take 1 capsule (20 mg total) by mouth daily. 90 capsule 3   sildenafil (VIAGRA) 100 MG tablet Take 1 tablet (100 mg total) by mouth  daily as needed for erectile dysfunction. 30 tablet 2   No current facility-administered medications on file prior to visit.   No Known Allergies Social History   Socioeconomic History   Marital status: Divorced    Spouse name: Not on file   Number of children: Not on file   Years of education: Not on file   Highest education level: Not on file  Occupational History   Not on file  Tobacco Use   Smoking status: Never   Smokeless tobacco: Current    Types: Snuff  Substance and Sexual Activity   Alcohol use: Yes    Comment: Once a month   Drug use: No   Sexual activity: Not on file  Other Topics Concern   Not on file  Social History Narrative   Not on file   Social Determinants of Health   Financial Resource Strain: Not on file  Food Insecurity: Not on file  Transportation Needs: Not on file  Physical Activity: Not on file  Stress: Not on file  Social Connections: Not on file  Intimate Partner Violence: Not on file     Review of Systems  All other systems reviewed and are negative.      Objective:   Physical Exam Vitals reviewed.  Constitutional:      General: He  is not in acute distress.    Appearance: Normal appearance. He is obese. He is not ill-appearing or toxic-appearing.  Cardiovascular:     Rate and Rhythm: Normal rate and regular rhythm.     Pulses: Normal pulses.     Heart sounds: Normal heart sounds. No murmur heard.    No friction rub. No gallop.  Pulmonary:     Effort: Pulmonary effort is normal. No respiratory distress.     Breath sounds: Normal breath sounds. No wheezing, rhonchi or rales.  Abdominal:     General: Abdomen is flat. Bowel sounds are normal. There is no distension.     Palpations: Abdomen is soft.     Tenderness: There is no abdominal tenderness. There is no guarding or rebound.  Musculoskeletal:     Lumbar back: Negative right straight leg raise test and negative left straight leg raise test.     Right lower leg: No edema.      Left lower leg: No edema.  Skin:    General: Skin is warm.     Coloration: Skin is not jaundiced.     Findings: No bruising, erythema, lesion or rash.  Neurological:     General: No focal deficit present.     Mental Status: He is alert and oriented to person, place, and time. Mental status is at baseline.     Cranial Nerves: No cranial nerve deficit.     Sensory: No sensory deficit.     Motor: No weakness.     Coordination: Coordination normal.     Gait: Gait normal.     Deep Tendon Reflexes: Reflexes normal.           Assessment & Plan:  Diabetes mellitus type 2 in nonobese (HCC) - Plan: CBC with Differential/Platelet, COMPLETE METABOLIC PANEL WITH GFR, Lipid panel, Hemoglobin A1c, Protein / Creatinine Ratio, Urine, sildenafil (VIAGRA) 100 MG tablet  Benign essential HTN I recommended a glucometer both to help manage his sugars and to reinforce good eating behavior with patient declined.  Blood pressure today is excellent.  Check A1c.  If A1c is greater than 6.5 I would recommend Ozempic to facilitate weight loss as well as a daily blood sugar checks to help educate the patient about how his diet impacts his blood sugar.  Patient is not on statin.  Check cholesterol.  If significantly elevated would recommend statin

## 2022-07-26 LAB — COMPLETE METABOLIC PANEL WITH GFR
AG Ratio: 1.6 (calc) (ref 1.0–2.5)
ALT: 40 U/L (ref 9–46)
AST: 27 U/L (ref 10–35)
Albumin: 4.7 g/dL (ref 3.6–5.1)
Alkaline phosphatase (APISO): 53 U/L (ref 35–144)
BUN: 19 mg/dL (ref 7–25)
CO2: 29 mmol/L (ref 20–32)
Calcium: 9.6 mg/dL (ref 8.6–10.3)
Chloride: 99 mmol/L (ref 98–110)
Creat: 1.16 mg/dL (ref 0.70–1.35)
Globulin: 3 g/dL (calc) (ref 1.9–3.7)
Glucose, Bld: 109 mg/dL — ABNORMAL HIGH (ref 65–99)
Potassium: 3.9 mmol/L (ref 3.5–5.3)
Sodium: 139 mmol/L (ref 135–146)
Total Bilirubin: 0.6 mg/dL (ref 0.2–1.2)
Total Protein: 7.7 g/dL (ref 6.1–8.1)
eGFR: 71 mL/min/{1.73_m2} (ref >=60)

## 2022-07-26 LAB — CBC WITH DIFFERENTIAL/PLATELET
Absolute Monocytes: 685 cells/uL (ref 200–950)
Basophils Absolute: 77 cells/uL (ref 0–200)
Basophils Relative: 1 %
Eosinophils Absolute: 123 cells/uL (ref 15–500)
Eosinophils Relative: 1.6 %
HCT: 44.1 % (ref 38.5–50.0)
Hemoglobin: 14.9 g/dL (ref 13.2–17.1)
Lymphs Abs: 2672 cells/uL (ref 850–3900)
MCH: 31.4 pg (ref 27.0–33.0)
MCHC: 33.8 g/dL (ref 32.0–36.0)
MCV: 92.8 fL (ref 80.0–100.0)
MPV: 11.6 fL (ref 7.5–12.5)
Monocytes Relative: 8.9 %
Neutro Abs: 4143 cells/uL (ref 1500–7800)
Neutrophils Relative %: 53.8 %
Platelets: 227 10*3/uL (ref 140–400)
RBC: 4.75 10*6/uL (ref 4.20–5.80)
RDW: 12.5 % (ref 11.0–15.0)
Total Lymphocyte: 34.7 %
WBC: 7.7 10*3/uL (ref 3.8–10.8)

## 2022-07-26 LAB — PROTEIN / CREATININE RATIO, URINE
Creatinine, Urine: 180 mg/dL (ref 20–320)
Protein/Creat Ratio: 44 mg/g creat (ref 25–148)
Protein/Creatinine Ratio: 0.044 mg/mg creat (ref 0.025–0.148)
Total Protein, Urine: 8 mg/dL (ref 5–25)

## 2022-07-26 LAB — LIPID PANEL
Cholesterol: 194 mg/dL (ref <200)
HDL: 32 mg/dL — ABNORMAL LOW (ref >=40)
LDL Cholesterol (Calc): 119 mg/dL (calc) — ABNORMAL HIGH
Non-HDL Cholesterol (Calc): 162 mg/dL (calc) — ABNORMAL HIGH (ref <130)
Total CHOL/HDL Ratio: 6.1 (calc) — ABNORMAL HIGH (ref <5.0)
Triglycerides: 302 mg/dL — ABNORMAL HIGH (ref <150)

## 2022-07-26 LAB — HEMOGLOBIN A1C
Hgb A1c MFr Bld: 6.7 % of total Hgb — ABNORMAL HIGH (ref <5.7)
Mean Plasma Glucose: 146 mg/dL
eAG (mmol/L): 8.1 mmol/L

## 2022-08-10 ENCOUNTER — Other Ambulatory Visit: Payer: Self-pay | Admitting: Family Medicine

## 2022-08-12 NOTE — Telephone Encounter (Signed)
Requested Prescriptions  Pending Prescriptions Disp Refills   hydrochlorothiazide (HYDRODIURIL) 25 MG tablet [Pharmacy Med Name: HYDROCHLOROTHIAZIDE 25MG  TABLETS] 90 tablet 0    Sig: TAKE 1 TABLET(25 MG) BY MOUTH DAILY     Cardiovascular: Diuretics - Thiazide Failed - 08/10/2022  7:20 AM      Failed - Valid encounter within last 6 months    Recent Outpatient Visits           1 year ago Left sided sciatica   [place] Susy Frizzle, MD   1 year ago Diabetes mellitus type 2 in nonobese [facility])   [place] Susy Frizzle, MD   1 year ago Diabetes mellitus type 2 in nonobese [facility])   [place] Susy Frizzle, MD   3 years ago Diabetes mellitus type 2 in nonobese [facility])   [place] Susy Frizzle, MD   3 years ago Diabetes mellitus type 2 in nonobese [facility])   [place] Pickard, Cammie Mcgee, MD              Passed - Cr in normal range and within 180 days    Creat  Date Value Ref Range Status  07/25/2022 1.16 0.70 - 1.35 mg/dL Final   Creatinine, Urine  Date Value Ref Range Status  07/25/2022 180 20 - 320 mg/dL Final         Passed - K in normal range and within 180 days    Potassium  Date Value Ref Range Status  07/25/2022 3.9 3.5 - 5.3 mmol/L Final         Passed - Na in normal range and within 180 days    Sodium  Date Value Ref Range Status  07/25/2022 139 135 - 146 mmol/L Final         Passed - Last BP in normal range    BP Readings from Last 1 Encounters:  07/25/22 132/80

## 2022-10-25 ENCOUNTER — Other Ambulatory Visit: Payer: Self-pay | Admitting: Family Medicine

## 2022-10-25 DIAGNOSIS — E119 Type 2 diabetes mellitus without complications: Secondary | ICD-10-CM

## 2022-11-07 ENCOUNTER — Other Ambulatory Visit: Payer: Self-pay | Admitting: Family Medicine

## 2022-11-08 NOTE — Telephone Encounter (Signed)
Last OV 07/25/22.  Requested Prescriptions  Pending Prescriptions Disp Refills   hydrochlorothiazide (HYDRODIURIL) 25 MG tablet [Pharmacy Med Name: HYDROCHLOROTHIAZIDE 25MG  TABLETS] 90 tablet 0    Sig: TAKE 1 TABLET(25 MG) BY MOUTH DAILY     Cardiovascular: Diuretics - Thiazide Failed - 11/07/2022  3:54 AM      Failed - Valid encounter within last 6 months    Recent Outpatient Visits           1 year ago Left sided sciatica   [facility] Donita Brooks, MD   1 year ago Diabetes mellitus type 2 in nonobese [facility])   [facility] Donita Brooks, MD   2 years ago Diabetes mellitus type 2 in nonobese [facility])   [facility] Donita Brooks, MD   3 years ago Diabetes mellitus type 2 in nonobese [facility])   [facility] Donita Brooks, MD   3 years ago Diabetes mellitus type 2 in nonobese [facility])   [facility] Pickard, Priscille Heidelberg, MD              Passed - Cr in normal range and within 180 days    Creat  Date Value Ref Range Status  07/25/2022 1.16 0.70 - 1.35 mg/dL Final   Creatinine, Urine  Date Value Ref Range Status  07/25/2022 180 20 - 320 mg/dL Final         Passed - K in normal range and within 180 days    Potassium  Date Value Ref Range Status  07/25/2022 3.9 3.5 - 5.3 mmol/L Final         Passed - Na in normal range and within 180 days    Sodium  Date Value Ref Range Status  07/25/2022 139 135 - 146 mmol/L Final         Passed - Last BP in normal range    BP Readings from Last 1 Encounters:  07/25/22 132/80

## 2022-12-14 ENCOUNTER — Other Ambulatory Visit: Payer: Self-pay | Admitting: Family Medicine

## 2022-12-14 DIAGNOSIS — E119 Type 2 diabetes mellitus without complications: Secondary | ICD-10-CM

## 2022-12-16 NOTE — Telephone Encounter (Signed)
Last 07/25/22, within protocol.  Requested Prescriptions  Pending Prescriptions Disp Refills   lisinopril (ZESTRIL) 20 MG tablet [Pharmacy Med Name: LISINOPRIL 20MG  TABLETS] 90 tablet 0    Sig: TAKE 1 TABLET BY MOUTH DAILY     Cardiovascular:  ACE Inhibitors Failed - 12/14/2022  8:03 AM      Failed - Valid encounter within last 6 months    Recent Outpatient Visits           1 year ago Left sided sciatica   [facility] Donita Brooks, MD   2 years ago Diabetes mellitus type 2 in nonobese [facility])   [facility] Donita Brooks, MD   2 years ago Diabetes mellitus type 2 in nonobese [facility])   [facility] Donita Brooks, MD   3 years ago Diabetes mellitus type 2 in nonobese [facility])   [facility] Donita Brooks, MD   3 years ago Diabetes mellitus type 2 in nonobese [facility])   [facility] Pickard, Priscille Heidelberg, MD              Passed - Cr in normal range and within 180 days    Creat  Date Value Ref Range Status  07/25/2022 1.16 0.70 - 1.35 mg/dL Final   Creatinine, Urine  Date Value Ref Range Status  07/25/2022 180 20 - 320 mg/dL Final         Passed - K in normal range and within 180 days    Potassium  Date Value Ref Range Status  07/25/2022 3.9 3.5 - 5.3 mmol/L Final         Passed - Patient is not pregnant      Passed - Last BP in normal range    BP Readings from Last 1 Encounters:  07/25/22 132/80

## 2023-02-03 ENCOUNTER — Other Ambulatory Visit: Payer: Self-pay | Admitting: Family Medicine

## 2023-02-04 NOTE — Telephone Encounter (Signed)
Last OV 07/25/22 Requested Prescriptions  Pending Prescriptions Disp Refills   hydrochlorothiazide (HYDRODIURIL) 25 MG tablet [Pharmacy Med Name: HYDROCHLOROTHIAZIDE 25MG  TABLETS] 90 tablet 0    Sig: TAKE 1 TABLET(25 MG) BY MOUTH DAILY     Cardiovascular: Diuretics - Thiazide Failed - 02/03/2023  3:57 AM      Failed - Cr in normal range and within 180 days    Creat  Date Value Ref Range Status  07/25/2022 1.16 0.70 - 1.35 mg/dL Final   Creatinine, Urine  Date Value Ref Range Status  07/25/2022 180 20 - 320 mg/dL Final         Failed - K in normal range and within 180 days    Potassium  Date Value Ref Range Status  07/25/2022 3.9 3.5 - 5.3 mmol/L Final         Failed - Na in normal range and within 180 days    Sodium  Date Value Ref Range Status  07/25/2022 139 135 - 146 mmol/L Final         Failed - Valid encounter within last 6 months    Recent Outpatient Visits           1 year ago Left sided sciatica   [facility] Donita Brooks, MD   2 years ago Diabetes mellitus type 2 in nonobese [facility])   [facility] Donita Brooks, MD   2 years ago Diabetes mellitus type 2 in nonobese [facility])   [facility] Donita Brooks, MD   3 years ago Diabetes mellitus type 2 in nonobese [facility])   [facility] Donita Brooks, MD   3 years ago Diabetes mellitus type 2 in nonobese [facility])   [facility] Pickard, Priscille Heidelberg, MD              Passed - Last BP in normal range    BP Readings from Last 1 Encounters:  07/25/22 132/80

## 2023-02-06 ENCOUNTER — Other Ambulatory Visit: Payer: Self-pay | Admitting: Family Medicine

## 2023-02-06 NOTE — Telephone Encounter (Signed)
Pharmacy did not receive refill. Requested Prescriptions  Pending Prescriptions Disp Refills   hydrochlorothiazide (HYDRODIURIL) 25 MG tablet [Pharmacy Med Name: HYDROCHLOROTHIAZIDE 25MG  TABLETS] 90 tablet 0    Sig: TAKE 1 TABLET(25 MG) BY MOUTH DAILY     Cardiovascular: Diuretics - Thiazide Failed - 02/06/2023  8:03 AM      Failed - Cr in normal range and within 180 days    Creat  Date Value Ref Range Status  07/25/2022 1.16 0.70 - 1.35 mg/dL Final   Creatinine, Urine  Date Value Ref Range Status  07/25/2022 180 20 - 320 mg/dL Final         Failed - K in normal range and within 180 days    Potassium  Date Value Ref Range Status  07/25/2022 3.9 3.5 - 5.3 mmol/L Final         Failed - Na in normal range and within 180 days    Sodium  Date Value Ref Range Status  07/25/2022 139 135 - 146 mmol/L Final         Failed - Valid encounter within last 6 months    Recent Outpatient Visits           1 year ago Left sided sciatica   [facility] Donita Brooks, MD   2 years ago Diabetes mellitus type 2 in nonobese [facility])   [facility] Donita Brooks, MD   2 years ago Diabetes mellitus type 2 in nonobese [facility])   [facility] Donita Brooks, MD   3 years ago Diabetes mellitus type 2 in nonobese [facility])   [facility] Donita Brooks, MD   3 years ago Diabetes mellitus type 2 in nonobese [facility])   [facility] Pickard, Priscille Heidelberg, MD              Passed - Last BP in normal range    BP Readings from Last 1 Encounters:  07/25/22 132/80

## 2023-03-21 ENCOUNTER — Other Ambulatory Visit: Payer: Self-pay | Admitting: Family Medicine

## 2023-03-21 DIAGNOSIS — E119 Type 2 diabetes mellitus without complications: Secondary | ICD-10-CM

## 2023-03-21 NOTE — Telephone Encounter (Signed)
Requested Prescriptions  Pending Prescriptions Disp Refills   lisinopril (ZESTRIL) 20 MG tablet [Pharmacy Med Name: LISINOPRIL 20MG  TABLETS] 90 tablet 0    Sig: TAKE 1 TABLET BY MOUTH DAILY     Cardiovascular:  ACE Inhibitors Failed - 03/21/2023  8:03 AM      Failed - Cr in normal range and within 180 days    Creat  Date Value Ref Range Status  07/25/2022 1.16 0.70 - 1.35 mg/dL Final   Creatinine, Urine  Date Value Ref Range Status  07/25/2022 180 20 - 320 mg/dL Final         Failed - K in normal range and within 180 days    Potassium  Date Value Ref Range Status  07/25/2022 3.9 3.5 - 5.3 mmol/L Final         Failed - Valid encounter within last 6 months    Recent Outpatient Visits           1 year ago Left sided sciatica   [facility] Donita Brooks, MD   2 years ago Diabetes mellitus type 2 in nonobese [facility])   [facility] Donita Brooks, MD   2 years ago Diabetes mellitus type 2 in nonobese [facility])   [facility] Donita Brooks, MD   3 years ago Diabetes mellitus type 2 in nonobese [facility])   [facility] Donita Brooks, MD   4 years ago Diabetes mellitus type 2 in nonobese [facility])   [facility] Pickard, Priscille Heidelberg, MD              Passed - Patient is not pregnant      Passed - Last BP in normal range    BP Readings from Last 1 Encounters:  07/25/22 132/80

## 2023-04-21 ENCOUNTER — Telehealth: Payer: Self-pay | Admitting: Family Medicine

## 2023-04-21 NOTE — Telephone Encounter (Signed)
Copied from CRM [id number]. Topic: General - Other >> Apr 18, 2023  3:30 PM Danika B wrote: Reason for CRM: Agent would like an update on status of patients insurance forms that were faxed over to be signed.

## 2023-06-17 ENCOUNTER — Other Ambulatory Visit: Payer: Self-pay | Admitting: Family Medicine

## 2023-06-17 DIAGNOSIS — E119 Type 2 diabetes mellitus without complications: Secondary | ICD-10-CM

## 2023-06-17 NOTE — Telephone Encounter (Signed)
 Requested Prescriptions  Pending Prescriptions Disp Refills   lisinopril  (ZESTRIL ) 20 MG tablet [Pharmacy Med Name: LISINOPRIL  20MG  TABLETS] 30 tablet 0    Sig: TAKE 1 TABLET BY MOUTH DAILY     Cardiovascular:  ACE Inhibitors Failed - 06/17/2023 11:35 AM      Failed - Cr in normal range and within 180 days    Creat  Date Value Ref Range Status  07/25/2022 1.16 0.70 - 1.35 mg/dL Final   Creatinine, Urine  Date Value Ref Range Status  07/25/2022 180 20 - 320 mg/dL Final         Failed - K in normal range and within 180 days    Potassium  Date Value Ref Range Status  07/25/2022 3.9 3.5 - 5.3 mmol/L Final         Failed - Valid encounter within last 6 months    Recent Outpatient Visits           1 year ago Left sided sciatica   [facility] Duanne Butler DASEN, MD   2 years ago Diabetes mellitus type 2 in nonobese [facility])   [facility] Duanne Butler DASEN, MD   2 years ago Diabetes mellitus type 2 in nonobese [facility])   [facility] Duanne Butler DASEN, MD   3 years ago Diabetes mellitus type 2 in nonobese [facility])   [facility] Duanne Butler DASEN, MD   4 years ago Diabetes mellitus type 2 in nonobese [facility])   [facility] Pickard, Butler DASEN, MD              Passed - Patient is not pregnant      Passed - Last BP in normal range    BP Readings from Last 1 Encounters:  07/25/22 132/80

## 2023-07-19 ENCOUNTER — Other Ambulatory Visit: Payer: Self-pay | Admitting: Family Medicine

## 2023-07-19 DIAGNOSIS — E119 Type 2 diabetes mellitus without complications: Secondary | ICD-10-CM

## 2023-07-21 NOTE — Telephone Encounter (Signed)
 Requested medication (s) are due for refill today: Yes  Requested medication (s) are on the active medication list: Yes  Last refill:  10/25/22  Future visit scheduled: No  Notes to clinic:  Protocol indicates pt. Needs appointment.    Requested Prescriptions  Pending Prescriptions Disp Refills   metFORMIN (GLUCOPHAGE) 500 MG tablet [Pharmacy Med Name: METFORMIN 500MG  TABLETS] 180 tablet 1    Sig: TAKE 1 TABLET(500 MG) BY MOUTH TWICE DAILY WITH A MEAL     Endocrinology:  Diabetes - Biguanides Failed - 07/21/2023  3:24 PM      Failed - Cr in normal range and within 360 days    Creat  Date Value Ref Range Status  07/25/2022 1.16 0.70 - 1.35 mg/dL Final   Creatinine, Urine  Date Value Ref Range Status  07/25/2022 180 20 - 320 mg/dL Final         Failed - HBA1C is between 0 and 7.9 and within 180 days    Hgb A1c MFr Bld  Date Value Ref Range Status  07/25/2022 6.7 (H) <5.7 % of total Hgb Final    Comment:    For someone without known diabetes, a hemoglobin A1c value of 6.5% or greater indicates that they may have  diabetes and this should be confirmed with a follow-up  test. . For someone with known diabetes, a value <7% indicates  that their diabetes is well controlled and a value  greater than or equal to 7% indicates suboptimal  control. A1c targets should be individualized based on  duration of diabetes, age, comorbid conditions, and  other considerations. . Currently, no consensus exists regarding use of hemoglobin A1c for diagnosis of diabetes for children. .          Failed - eGFR in normal range and within 360 days    GFR, Est African American  Date Value Ref Range Status  10/31/2020 110 > OR = 60 mL/min/1.50m2 Final   GFR, Est Non African American  Date Value Ref Range Status  10/31/2020 95 > OR = 60 mL/min/1.54m2 Final   eGFR  Date Value Ref Range Status  07/25/2022 71 > OR = 60 mL/min/1.64m2 Final         Failed - B12 Level in normal range and  within 720 days    No results found for: "VITAMINB12"       Failed - Valid encounter within last 6 months    Recent Outpatient Visits           2 years ago Left sided sciatica   [facility] Donita Brooks, MD   2 years ago Diabetes mellitus type 2 in nonobese [facility])   [facility] Donita Brooks, MD   2 years ago Diabetes mellitus type 2 in nonobese [facility])   [facility] Donita Brooks, MD   4 years ago Diabetes mellitus type 2 in nonobese [facility])   [facility] Donita Brooks, MD   4 years ago Diabetes mellitus type 2 in nonobese [facility])   [facility] Pickard, Priscille Heidelberg, MD              Failed - CBC within normal limits and completed in the last 12 months    WBC  Date Value Ref Range Status  07/25/2022 7.7 3.8 - 10.8 Thousand/uL Final   RBC  Date Value Ref Range Status  07/25/2022 4.75 4.20 - 5.80 Million/uL Final   Hemoglobin  Date Value Ref Range Status  07/25/2022 14.9 13.2 - 17.1 g/dL Final   HCT  Date Value Ref Range Status  07/25/2022 44.1 38.5 - 50.0 % Final   MCHC  Date Value Ref Range Status  07/25/2022 33.8 32.0 - 36.0 g/dL Final   [facility]  Date Value Ref Range Status  07/25/2022 31.4 27.0 - 33.0 pg Final   MCV  Date Value Ref Range Status  07/25/2022 92.8 80.0 - 100.0 fL Final   No results found for: "PLTCOUNTKUC", "LABPLAT", "POCPLA" RDW  Date Value Ref Range Status  07/25/2022 12.5 11.0 - 15.0 % Final

## 2023-08-15 ENCOUNTER — Other Ambulatory Visit: Payer: Self-pay | Admitting: Family Medicine

## 2023-08-15 NOTE — Telephone Encounter (Signed)
 Requested medication (s) are due for refill today: yes  Requested medication (s) are on the active medication list: yes  Last refill:  02/06/23 #90/0  Future visit scheduled: no  Notes to clinic:  pt due for CPE and updated labs      Requested Prescriptions  Pending Prescriptions Disp Refills   hydrochlorothiazide (HYDRODIURIL) 25 MG tablet [Pharmacy Med Name: HYDROCHLOROTHIAZIDE 25MG  TABLETS] 90 tablet 0    Sig: TAKE 1 TABLET(25 MG) BY MOUTH DAILY     Cardiovascular: Diuretics - Thiazide Failed - 08/15/2023  3:53 PM      Failed - Cr in normal range and within 180 days    Creat  Date Value Ref Range Status  07/25/2022 1.16 0.70 - 1.35 mg/dL Final   Creatinine, Urine  Date Value Ref Range Status  07/25/2022 180 20 - 320 mg/dL Final         Failed - K in normal range and within 180 days    Potassium  Date Value Ref Range Status  07/25/2022 3.9 3.5 - 5.3 mmol/L Final         Failed - Na in normal range and within 180 days    Sodium  Date Value Ref Range Status  07/25/2022 139 135 - 146 mmol/L Final         Failed - Valid encounter within last 6 months    Recent Outpatient Visits           1 year ago Diabetes mellitus type 2 in nonobese [facility])   [facility] Donita Brooks, MD   1 year ago General medical exam   [facility] Donita Brooks, MD              Passed - Last BP in normal range    BP Readings from Last 1 Encounters:  07/25/22 132/80

## 2023-09-02 ENCOUNTER — Encounter: Payer: Self-pay | Admitting: Specialist

## 2023-09-02 ENCOUNTER — Other Ambulatory Visit: Payer: Self-pay | Admitting: Specialist

## 2023-09-02 DIAGNOSIS — M5459 Other low back pain: Secondary | ICD-10-CM

## 2023-09-03 ENCOUNTER — Ambulatory Visit
Admission: RE | Admit: 2023-09-03 | Discharge: 2023-09-03 | Disposition: A | Discharge status: Home / Self Care | Source: Ambulatory Visit | Attending: Specialist | Admitting: Specialist

## 2023-09-03 DIAGNOSIS — M5459 Other low back pain: Secondary | ICD-10-CM

## 2023-09-12 ENCOUNTER — Encounter: Payer: Self-pay | Admitting: Family Medicine

## 2023-09-12 ENCOUNTER — Ambulatory Visit: Admitting: Family Medicine

## 2023-09-12 VITALS — BP 136/86 | HR 81 | Temp 97.5°F | Ht 70.0 in | Wt 227.6 lb

## 2023-09-12 DIAGNOSIS — E119 Type 2 diabetes mellitus without complications: Secondary | ICD-10-CM

## 2023-09-12 DIAGNOSIS — Z125 Encounter for screening for malignant neoplasm of prostate: Secondary | ICD-10-CM | POA: Diagnosis not present

## 2023-09-12 DIAGNOSIS — Z7984 Long term (current) use of oral hypoglycemic drugs: Secondary | ICD-10-CM

## 2023-09-12 DIAGNOSIS — I10 Essential (primary) hypertension: Secondary | ICD-10-CM

## 2023-09-12 NOTE — H&P (View-Only) (Signed)
 Subjective:    Patient ID: Chad [name], male    DOB: 1960/12/29, 63 y.o.   MRN: [id number] Patient here today for preoperative clearance.  Recently had MRI that showed bulging disc in L4 and 5 impinging upon the L4 nerve root and L5 nerve root.  He is unable to tolerate pain.  He is scheduled surgery with Dr. Marlys Singh however they are requiring surgical clearance.  He denies any angina.  He denies any chest pain.  He denies any shortness of breath.  His blood pressure is adequately controlled at 136/86.  However he has a history of diabetes.  He is currently on metformin  500 mg twice daily.  His last hemoglobin A1c however was overcorrected.  He has been checked his sugar.  He admits that he taking a lot of prednisone  over the last 2 weeks.  Past Medical History:  Diagnosis Date   Diabetes mellitus type 2 in nonobese [facility])    Erectile dysfunction    GERD (gastroesophageal reflux disease)    Sleep apnea    Past Surgical History:  Procedure Laterality Date   CERVICAL SPINE SURGERY  2002, 2004   COLONOSCOPY N/A 02/09/2014   Procedure: COLONOSCOPY;  Surgeon: Ruby Corporal, MD;  Location: [facility];  Service: Endoscopy;  Laterality: N/A;  730 - rescheduled to 930 - Ann notified pt   Left knee arthroscopy Left 09/2021   Current Outpatient Medications on File Prior to Visit  Medication Sig Dispense Refill   cetirizine  (ZYRTEC ) 10 MG tablet Take 1 tablet (10 mg total) by mouth daily. 90 tablet 1   hydrochlorothiazide  (HYDRODIURIL ) 25 MG tablet TAKE 1 TABLET(25 MG) BY MOUTH DAILY 90 tablet 0   lisinopril  (ZESTRIL ) 20 MG tablet TAKE 1 TABLET BY MOUTH DAILY 30 tablet 0   meloxicam  (MOBIC ) 15 MG tablet TAKE 1 TABLET(15 MG) BY MOUTH DAILY 30 tablet 1   metFORMIN  (GLUCOPHAGE ) 500 MG tablet TAKE 1 TABLET(500 MG) BY MOUTH TWICE DAILY WITH A MEAL 180 tablet 1   omeprazole  (PRILOSEC) 20 MG capsule Take 1 capsule (20 mg total) by mouth daily. 90 capsule 3   sildenafil  (VIAGRA ) 100 MG tablet Take 1  tablet (100 mg total) by mouth daily as needed for erectile dysfunction. 30 tablet 2   No current facility-administered medications on file prior to visit.   No Known Allergies Social History   Socioeconomic History   Marital status: Divorced    Spouse name: Not on file   Number of children: Not on file   Years of education: Not on file   Highest education level: Not on file  Occupational History   Not on file  Tobacco Use   Smoking status: Never   Smokeless tobacco: Current    Types: Snuff  Substance and Sexual Activity   Alcohol use: Yes    Comment: Once a month   Drug use: No   Sexual activity: Not on file  Other Topics Concern   Not on file  Social History Narrative   Not on file   Social Drivers of Health   Financial Resource Strain: Not on file  Food Insecurity: Not on file  Transportation Needs: Not on file  Physical Activity: Not on file  Stress: Not on file  Social Connections: Not on file  Intimate Partner Violence: Not on file     Review of Systems  All other systems reviewed and are negative.      Objective:   Physical Exam Vitals reviewed.  Constitutional:  General: He is not in acute distress.    Appearance: Normal appearance. He is obese. He is not ill-appearing or toxic-appearing.  Cardiovascular:     Rate and Rhythm: Normal rate and regular rhythm.     Pulses: Normal pulses.     Heart sounds: Normal heart sounds. No murmur heard.    No friction rub. No gallop.  Pulmonary:     Effort: Pulmonary effort is normal. No respiratory distress.     Breath sounds: Normal breath sounds. No wheezing, rhonchi or rales.  Abdominal:     General: Abdomen is flat. Bowel sounds are normal. There is no distension.     Palpations: Abdomen is soft.     Tenderness: There is no abdominal tenderness. There is no guarding or rebound.  Musculoskeletal:     Lumbar back: Negative right straight leg raise test and negative left straight leg raise test.      Right lower leg: No edema.     Left lower leg: No edema.  Skin:    General: Skin is warm.     Coloration: Skin is not jaundiced.     Findings: No bruising, erythema, lesion or rash.  Neurological:     General: No focal deficit present.     Mental Status: He is alert and oriented to person, place, and time. Mental status is at baseline.     Cranial Nerves: No cranial nerve deficit.     Sensory: No sensory deficit.     Motor: No weakness.     Coordination: Coordination normal.     Gait: Gait normal.     Deep Tendon Reflexes: Reflexes normal.           Assessment & Plan:  Benign essential HTN  Diabetes mellitus type 2 in nonobese (HCC) - Plan: CBC with Differential/Platelet, COMPLETE METABOLIC PANEL WITHOUT GFR, Hemoglobin A1c, LDL Cholesterol, Direct  Prostate cancer screening - Plan: PSA I see no contraindication to surgery as long as his diabetes is adequately controlled.  I will check a hemoglobin A1c today.  I feel if his hemoglobin A1c is less than 7.5, the patient can safely proceed with surgery with low risk of complication.  I would ideally like to see his A1c less than 6.5.  If not less than 5 I would recommend increasing metformin  to 1000 mg twice daily.  If his LDL cholesterol is greater than 100 I would recommend a statin.  I will screen for prostate cancer with PSA

## 2023-09-12 NOTE — Progress Notes (Signed)
 Subjective:    Patient ID: Chad [name], male    DOB: 1960/12/29, 63 y.o.   MRN: [id number] Patient here today for preoperative clearance.  Recently had MRI that showed bulging disc in L4 and 5 impinging upon the L4 nerve root and L5 nerve root.  He is unable to tolerate pain.  He is scheduled surgery with Dr. Marlys Singh however they are requiring surgical clearance.  He denies any angina.  He denies any chest pain.  He denies any shortness of breath.  His blood pressure is adequately controlled at 136/86.  However he has a history of diabetes.  He is currently on metformin  500 mg twice daily.  His last hemoglobin A1c however was overcorrected.  He has been checked his sugar.  He admits that he taking a lot of prednisone  over the last 2 weeks.  Past Medical History:  Diagnosis Date   Diabetes mellitus type 2 in nonobese [facility])    Erectile dysfunction    GERD (gastroesophageal reflux disease)    Sleep apnea    Past Surgical History:  Procedure Laterality Date   CERVICAL SPINE SURGERY  2002, 2004   COLONOSCOPY N/A 02/09/2014   Procedure: COLONOSCOPY;  Surgeon: Ruby Corporal, MD;  [facility];  Service: Endoscopy;  Laterality: N/A;  730 - rescheduled to 930 - Ann notified pt   Left knee arthroscopy Left 09/2021   Current Outpatient Medications on File Prior to Visit  Medication Sig Dispense Refill   cetirizine  (ZYRTEC ) 10 MG tablet Take 1 tablet (10 mg total) by mouth daily. 90 tablet 1   hydrochlorothiazide  (HYDRODIURIL ) 25 MG tablet TAKE 1 TABLET(25 MG) BY MOUTH DAILY 90 tablet 0   lisinopril  (ZESTRIL ) 20 MG tablet TAKE 1 TABLET BY MOUTH DAILY 30 tablet 0   meloxicam  (MOBIC ) 15 MG tablet TAKE 1 TABLET(15 MG) BY MOUTH DAILY 30 tablet 1   metFORMIN  (GLUCOPHAGE ) 500 MG tablet TAKE 1 TABLET(500 MG) BY MOUTH TWICE DAILY WITH A MEAL 180 tablet 1   omeprazole  (PRILOSEC) 20 MG capsule Take 1 capsule (20 mg total) by mouth daily. 90 capsule 3   sildenafil  (VIAGRA ) 100 MG tablet Take 1  tablet (100 mg total) by mouth daily as needed for erectile dysfunction. 30 tablet 2   No current facility-administered medications on file prior to visit.   No Known Allergies Social History   Socioeconomic History   Marital status: Divorced    Spouse name: Not on file   Number of children: Not on file   Years of education: Not on file   Highest education level: Not on file  Occupational History   Not on file  Tobacco Use   Smoking status: Never   Smokeless tobacco: Current    Types: Snuff  Substance and Sexual Activity   Alcohol use: Yes    Comment: Once a month   Drug use: No   Sexual activity: Not on file  Other Topics Concern   Not on file  Social History Narrative   Not on file   Social Drivers of Health   Financial Resource Strain: Not on file  Food Insecurity: Not on file  Transportation Needs: Not on file  Physical Activity: Not on file  Stress: Not on file  Social Connections: Not on file  Intimate Partner Violence: Not on file     Review of Systems  All other systems reviewed and are negative.      Objective:   Physical Exam Vitals reviewed.  Constitutional:  General: He is not in acute distress.    Appearance: Normal appearance. He is obese. He is not ill-appearing or toxic-appearing.  Cardiovascular:     Rate and Rhythm: Normal rate and regular rhythm.     Pulses: Normal pulses.     Heart sounds: Normal heart sounds. No murmur heard.    No friction rub. No gallop.  Pulmonary:     Effort: Pulmonary effort is normal. No respiratory distress.     Breath sounds: Normal breath sounds. No wheezing, rhonchi or rales.  Abdominal:     General: Abdomen is flat. Bowel sounds are normal. There is no distension.     Palpations: Abdomen is soft.     Tenderness: There is no abdominal tenderness. There is no guarding or rebound.  Musculoskeletal:     Lumbar back: Negative right straight leg raise test and negative left straight leg raise test.      Right lower leg: No edema.     Left lower leg: No edema.  Skin:    General: Skin is warm.     Coloration: Skin is not jaundiced.     Findings: No bruising, erythema, lesion or rash.  Neurological:     General: No focal deficit present.     Mental Status: He is alert and oriented to person, place, and time. Mental status is at baseline.     Cranial Nerves: No cranial nerve deficit.     Sensory: No sensory deficit.     Motor: No weakness.     Coordination: Coordination normal.     Gait: Gait normal.     Deep Tendon Reflexes: Reflexes normal.           Assessment & Plan:  Benign essential HTN  Diabetes mellitus type 2 in nonobese (HCC) - Plan: CBC with Differential/Platelet, COMPLETE METABOLIC PANEL WITHOUT GFR, Hemoglobin A1c, LDL Cholesterol, Direct  Prostate cancer screening - Plan: PSA I see no contraindication to surgery as long as his diabetes is adequately controlled.  I will check a hemoglobin A1c today.  I feel if his hemoglobin A1c is less than 7.5, the patient can safely proceed with surgery with low risk of complication.  I would ideally like to see his A1c less than 6.5.  If not less than 5 I would recommend increasing metformin  to 1000 mg twice daily.  If his LDL cholesterol is greater than 100 I would recommend a statin.  I will screen for prostate cancer with PSA

## 2023-09-13 LAB — COMPLETE METABOLIC PANEL WITHOUT GFR
AG Ratio: 1.9 (calc) (ref 1.0–2.5)
ALT: 31 U/L (ref 9–46)
AST: 19 U/L (ref 10–35)
Albumin: 4.6 g/dL (ref 3.6–5.1)
Alkaline phosphatase (APISO): 55 U/L (ref 35–144)
BUN: 21 mg/dL (ref 7–25)
CO2: 27 mmol/L (ref 20–32)
Calcium: 9.8 mg/dL (ref 8.6–10.3)
Chloride: 102 mmol/L (ref 98–110)
Creat: 1.16 mg/dL (ref 0.70–1.35)
Globulin: 2.4 g/dL (ref 1.9–3.7)
Glucose, Bld: 100 mg/dL — ABNORMAL HIGH (ref 65–99)
Potassium: 4.4 mmol/L (ref 3.5–5.3)
Sodium: 139 mmol/L (ref 135–146)
Total Bilirubin: 0.7 mg/dL (ref 0.2–1.2)
Total Protein: 7 g/dL (ref 6.1–8.1)

## 2023-09-13 LAB — CBC WITH DIFFERENTIAL/PLATELET
Absolute Lymphocytes: 2420 {cells}/uL (ref 850–3900)
Absolute Monocytes: 644 {cells}/uL (ref 200–950)
Basophils Absolute: 74 {cells}/uL (ref 0–200)
Basophils Relative: 0.8 %
Eosinophils Absolute: 9 {cells}/uL — ABNORMAL LOW (ref 15–500)
Eosinophils Relative: 0.1 %
HCT: 45 % (ref 38.5–50.0)
Hemoglobin: 15.6 g/dL (ref 13.2–17.1)
MCH: 31.9 pg (ref 27.0–33.0)
MCHC: 34.7 g/dL (ref 32.0–36.0)
MCV: 92 fL (ref 80.0–100.0)
MPV: 11.8 fL (ref 7.5–12.5)
Monocytes Relative: 7 %
Neutro Abs: 6054 {cells}/uL (ref 1500–7800)
Neutrophils Relative %: 65.8 %
Platelets: 216 10*3/uL (ref 140–400)
RBC: 4.89 10*6/uL (ref 4.20–5.80)
RDW: 12.5 % (ref 11.0–15.0)
Total Lymphocyte: 26.3 %
WBC: 9.2 10*3/uL (ref 3.8–10.8)

## 2023-09-13 LAB — HEMOGLOBIN A1C
Hgb A1c MFr Bld: 6.4 % — ABNORMAL HIGH (ref <5.7)
Mean Plasma Glucose: 137 mg/dL
eAG (mmol/L): 7.6 mmol/L

## 2023-09-13 LAB — LDL CHOLESTEROL, DIRECT: Direct LDL: 108 mg/dL — ABNORMAL HIGH (ref <100)

## 2023-09-13 LAB — PSA: PSA: 1.02 ng/mL (ref <=4.00)

## 2023-09-15 ENCOUNTER — Other Ambulatory Visit: Payer: Self-pay | Admitting: Family Medicine

## 2023-09-15 ENCOUNTER — Telehealth: Payer: Self-pay

## 2023-09-15 DIAGNOSIS — E119 Type 2 diabetes mellitus without complications: Secondary | ICD-10-CM

## 2023-09-15 NOTE — Telephone Encounter (Signed)
 Copied from CRM [id number]. Topic: General - Other >> Sep 15, 2023  2:30 PM Rosamond Comes wrote: Reason for CRM: patient calling asking to have the medical release form re faxed to [facility] Amanda Jungling for scheduling surgery

## 2023-09-15 NOTE — Telephone Encounter (Signed)
 Copied from CRM [id number]. Topic: Clinical - Medication Refill >> Sep 15, 2023  9:41 AM Adan Adas P wrote: Most Recent Primary Care Visit:  Provider: Eliane Grooms T  Department: [facility]  Visit Type: ACUTE  Date: 09/12/2023  Medication: lisinopril  (ZESTRIL ) 20 MG tablet and hydrochlorothiazide  (HYDRODIURIL ) 25 MG tablet  Has the patient contacted their pharmacy? Yes (Agent: If no, request that the patient contact the pharmacy for the refill. If patient does not wish to contact the pharmacy document the reason why and proceed with request.) (Agent: If yes, when and what did the pharmacy advise?)  Is this the correct pharmacy for this prescription? Yes If no, delete pharmacy and type the correct one.  This is the patient's preferred pharmacy:  [id number] Jonette Nestle, [place] - [address] AT [facility] OF [address] [facility] [address] [place] [address] Phone: [phone] Fax: [phone]   Has the prescription been filled recently? Yes  Is the patient out of the medication? Yes  Has the patient been seen for an appointment in the last year OR does the patient have an upcoming appointment? Yes  Can we respond through MyChart? Yes  Agent: Please be advised that Rx refills may take up to 3 business days. We ask that you follow-up with your pharmacy.

## 2023-09-17 ENCOUNTER — Ambulatory Visit: Payer: Self-pay | Admitting: Orthopedic Surgery

## 2023-09-17 DIAGNOSIS — M5126 Other intervertebral disc displacement, lumbar region: Secondary | ICD-10-CM

## 2023-09-17 MED ORDER — LISINOPRIL 20 MG PO TABS: 20.0000 mg | ORAL_TABLET | Freq: Every day | ORAL | 0 refills | Status: DC

## 2023-09-17 MED ORDER — HYDROCHLOROTHIAZIDE 25 MG PO TABS: 25.0000 mg | ORAL_TABLET | Freq: Every day | ORAL | 0 refills | Status: DC

## 2023-09-17 NOTE — Telephone Encounter (Signed)
 Last OV 08/29/23, within protocol  Requested Prescriptions  Pending Prescriptions Disp Refills   lisinopril  (ZESTRIL ) 20 MG tablet 90 tablet 0    Sig: Take 1 tablet (20 mg total) by mouth daily.     Cardiovascular:  ACE Inhibitors Failed - 09/17/2023  8:13 AM      Failed - Valid encounter within last 6 months    Recent Outpatient Visits           5 days ago Benign essential HTN   [place] [facility] Pickard, Cisco Crest, MD   1 year ago Diabetes mellitus type 2 in nonobese [facility])   [place] [facility] Austine Lefort, MD   1 year ago General medical exam   [place] [facility] Austine Lefort, MD              Passed - Cr in normal range and within 180 days    Creat  Date Value Ref Range Status  09/12/2023 1.16 0.70 - 1.35 mg/dL Final   Creatinine, Urine  Date Value Ref Range Status  07/25/2022 180 20 - 320 mg/dL Final         Passed - K in normal range and within 180 days    Potassium  Date Value Ref Range Status  09/12/2023 4.4 3.5 - 5.3 mmol/L Final         Passed - Patient is not pregnant      Passed - Last BP in normal range    BP Readings from Last 1 Encounters:  09/12/23 136/86          hydrochlorothiazide  (HYDRODIURIL ) 25 MG tablet 90 tablet 0    Sig: Take 1 tablet (25 mg total) by mouth daily.     Cardiovascular: Diuretics - Thiazide Failed - 09/17/2023  8:13 AM      Failed - Valid encounter within last 6 months    Recent Outpatient Visits           5 days ago Benign essential HTN   [place] [facility] Pickard, Cisco Crest, MD   1 year ago Diabetes mellitus type 2 in nonobese [facility])   [place] [facility] Austine Lefort, MD   1 year ago General medical exam   [place] [facility] Austine Lefort, MD              Passed - Cr in normal range and within 180 days    Creat  Date Value Ref Range Status  09/12/2023 1.16 0.70 -  1.35 mg/dL Final   Creatinine, Urine  Date Value Ref Range Status  07/25/2022 180 20 - 320 mg/dL Final         Passed - K in normal range and within 180 days    Potassium  Date Value Ref Range Status  09/12/2023 4.4 3.5 - 5.3 mmol/L Final         Passed - Na in normal range and within 180 days    Sodium  Date Value Ref Range Status  09/12/2023 139 135 - 146 mmol/L Final         Passed - Last BP in normal range    BP Readings from Last 1 Encounters:  09/12/23 136/86

## 2023-09-17 NOTE — Telephone Encounter (Signed)
 Refilled 09/17/23. Requested Prescriptions  Refused Prescriptions Disp Refills   hydrochlorothiazide  (HYDRODIURIL ) 25 MG tablet [Pharmacy Med Name: HYDROCHLOROTHIAZIDE  25MG  TABLETS] 90 tablet 0    Sig: TAKE 1 TABLET(25 MG) BY MOUTH DAILY     There is no refill protocol information for this order

## 2023-09-18 ENCOUNTER — Ambulatory Visit: Payer: Self-pay | Admitting: Orthopedic Surgery

## 2023-09-18 NOTE — H&P (Signed)
 Chad [name] is an 64 y.o. male.   Chief Complaint: back and left leg pain HPI: Reason for Visit: Diagnositc Results (lumbar MRI) Context: 2.5 months Location (Lower Extremity): lower back pain on the left; leg pain on the left, , ; foot pain on the left, , Severity: pain level 8/10 Aggravating Factors: standing for ; walking for ; sitting for ; lying down Associated Symptoms: numbness/tingling (LLE) Medications: gabapentin  300mg  2 tid Patient has been doing a home exercise program he does extensions of lumbar spine as a lumbar support that seems to temporarily help. He is unable to do his core strengthening as it aggravates his left leg. It radiates down his leg there is numbness now some weakness. The gabapentin  does not help  Past Medical History:  Diagnosis Date   Diabetes mellitus type 2 in nonobese [facility])    Erectile dysfunction    GERD (gastroesophageal reflux disease)    Sleep apnea     Past Surgical History:  Procedure Laterality Date   CERVICAL SPINE SURGERY  2002, 2004   COLONOSCOPY N/A 02/09/2014   Procedure: COLONOSCOPY;  Surgeon: Ruby Corporal, MD;  [facility];  Service: Endoscopy;  Laterality: N/A;  730 - rescheduled to 930 - Ann notified pt   Left knee arthroscopy Left 09/2021    Family History  Problem Relation Age of Onset   Colon cancer Neg Hx    Social History:  reports that he has never smoked. His smokeless tobacco use includes snuff. He reports current alcohol use. He reports that he does not use drugs.  Allergies:  Allergies  Allergen Reactions   Grass Pollen(K-O-R-T-Swt Vern) Rash   Current meds: aspirin 81 mg tablet,delayed release chlorhexidine gluconate 4 % topical liquid gabapentin  hydroCHLOROthiazide  25 mg tablet lisinopriL  20 mg tablet metFORMIN  500 mg tablet omeprazole  20 mg capsule,delayed release oxyCODONE-acetaminophen 5 mg-325 mg tablet  Review of Systems  Constitutional: Negative.   HENT: Negative.    Eyes:  Negative.   Respiratory: Negative.    Cardiovascular: Negative.   Gastrointestinal: Negative.   Endocrine: Negative.   Genitourinary: Negative.   Musculoskeletal:  Positive for back pain and gait problem.  Neurological:  Positive for weakness and numbness.  Psychiatric/Behavioral: Negative.      There were no vitals taken for this visit. Physical Exam Constitutional:      Appearance: Normal appearance.  HENT:     Head: Normocephalic and atraumatic.     Right Ear: External ear normal.     Left Ear: External ear normal.     Nose: Nose normal.     Mouth/Throat:     Pharynx: Oropharynx is clear.  Eyes:     Conjunctiva/sclera: Conjunctivae normal.  Cardiovascular:     Rate and Rhythm: Normal rate and regular rhythm.     Pulses: Normal pulses.     Heart sounds: Normal heart sounds.  Pulmonary:     Effort: Pulmonary effort is normal.     Breath sounds: Normal breath sounds.  Abdominal:     General: Bowel sounds are normal.  Musculoskeletal:     Cervical back: Normal range of motion.     Comments: Gait and Station: Appearance: ambulating with no assistive devices and antalgic gait.  Constitutional: General Appearance: healthy-appearing and distress (mild).  Psychiatric: Mood and Affect: active and alert.  Cardiovascular System: Edema Right: none; Dorsalis and posterior tibial pulses 2+. Edema Left: none.  Abdomen: Inspection and Palpation: non-distended and no tenderness.  Skin: Inspection and palpation: no  rash.  Lumbar Spine: Inspection: normal alignment. Bony Palpation of the Lumbar Spine: tender at lumbosacral junction.. Bony Palpation of the Right Hip: no tenderness of the greater trochanter and tenderness of the SI joint; Pelvis stable. Bony Palpation of the Left Hip: no tenderness of the greater trochanter and tenderness of the SI joint. Soft Tissue Palpation on the Right: No flank pain with percussion. Active Range of Motion: limited flexion and extention.  Motor  Strength: L1 Motor Strength on the Right: hip flexion iliopsoas 5/5. L1 Motor Strength on the Left: hip flexion iliopsoas 5/5. L2-L4 Motor Strength on the Right: knee extension quadriceps 5/5. L2-L4 Motor Strength on the Left: knee extension quadriceps 5/5. L5 Motor Strength on the Right: ankle dorsiflexion tibialis anterior 5/5 and great toe extension extensor hallucis longus 5/5. L5 Motor Strength on the Left: ankle dorsiflexion tibialis anterior 5/5 and great toe extension extensor hallucis longus 3/5. S1 Motor Strength on the Right: plantar flexion gastrocnemius 5/5. S1 Motor Strength on the Left: plantar flexion gastrocnemius 5/5.  Neurological System: Knee Reflex Right: normal (2). Knee Reflex Left: normal (2). Ankle Reflex Right: normal (2). Ankle Reflex Left: normal (2). Babinski Reflex Right: plantar reflex absent. Babinski Reflex Left: plantar reflex absent. Sensation on the Right: normal distal extremities. Sensation on the Left: normal distal extremities and decreased sensation on the lateral leg and dorsum of the foot (L5). Special Tests on the Right: no clonus of the ankle/knee and seated straight leg raising test positive. Special Tests on the Left: no clonus of the ankle/knee and seated straight leg raising test positive.  Skin:    General: Skin is warm and dry.  Neurological:     Mental Status: He is alert.    Previous three-view x-rays demonstrate transitional segment L5-S1 mild multilevel disc degeneration. No instability in flexion extension. Mild scoliosis  Previous MRI 2002 demonstrates this paracentral disc protrusion L4-5 to the right neuroforaminal stenosis at L4 on the left  MRI lumbar spine outside facility DRI independently reviewed by myself demonstrates progression of disc degeneration L4-5. There is a shallow disc bulge prominent to the left protruding disc in the left lateral recess and foramen effacing the L4 and the L5 nerve roots.  Assessment/Plan Impression:  1.  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 Has been refractory to home exercise program epidural steroid excetra  Plan:  At this point in time failing conservative treatment and presence of neurologic deficit we discussed a microdiscectomy and a hemilaminotomy at L4-5.  I had an extensive discussion with the patient concerning the pathology relevant anatomy and treatment options. At this point exhausting conservative treatment and in the presence of a neurologic deficit we discussed microlumbar decompression. I discussed the risks and benefits including bleeding, infection, DVT, PE, anesthetic complications, worsening in their symptoms, improvement in their symptoms, C SF leakage, epidural fibrosis, need for future surgeries such as revision discectomy and lumbar fusion. I also indicated that this is an operation to basically decompress the nerve roots to allow recovery as opposed to fixing a herniated disc if it is encountered and that the incidence of recurrent chest disc herniation can approach 15%. Also that nerve root recovery is variable and may not recover completely. Any ligament or bone that is contributing to compressing the nerves will be removed as well.  I discussed the operative course including overnight in the hospital. Immediate ambulation. Follow-up in 2 weeks for suture removal. 6 weeks until healing of the herniation and surgical incision followed by 6 weeks of reconditioning and strengthening of the core musculature. Also discussed the need to employ the concepts of disc pressure management and core motion following the surgery to minimize the risk of recurrent disc herniation. We will obtain preoperative clearance i if necessary and proceed  accordingly.    Patient was given a prescription for gabapentin  previously to be taken as directed. It is to be titrated for effect up to 3 times a day for neuropathic pain as needed. Starting with 1 a day for 3 days, then 2 a day for 3 days, then 3 times a day as needed. It is frequent that during the day the side effects are not tolerated and therefore taken only at night. Symptoms such as dizziness and being groggy. Occasionally there is fluid retention and weight gain associated with this medication. When the pain subsides and the medication is no longer needed it should be discontinued by slowly weaning off the medication. If taking it 3 times a day then it should be decreased to 2 times a day for a week and then 1 time a day for a week and then as needed.  A prescription for an opioid was given to be taken as directed for pain control. I discussed the risks including cognitive changes which may affect the ability to operate machinery and to drive. In addition the side effect of constipation as well as to avoid taking with conflicting medications that were discussed. The patient's prescription drug monitoring report was reviewed with no red flags noted.  Patient is to call or present to the emergency room if there is any numbness or weakness to suggest worsening of their condition. In addition although rare if there is loss of bowel or bladder function such as incontinence or inability to void that this may represent a cauda equina syndrome. If noted the patient is to present immediately to the emergency room for evaluation and treatment otherwise permanent bowel or bladder dysfunction can occur as a result.  Patient is to call or present to the emergency room if there is any numbness or weakness to suggest worsening of their condition. In addition although rare if there is loss of bowel or bladder function such as incontinence or inability to void that this may represent a cauda equina syndrome.  If noted the patient is to present immediately to the emergency room for evaluation and treatment otherwise permanent bowel or bladder dysfunction can occur as a result.  History of hypertension. Currently controlled. Preoperative clearance Remote exposure to MRSA. Not recently tested.  Preoperative chlorhexidine. Kefzol, oxycodone. No history of DVT. Will proceed ASAP  Plan hemilaminotomy, microdiscectomy L4-5 left  Barba Levin, PA-C for Dr Leighton Punches 09/18/2023, 12:19 PM

## 2023-09-18 NOTE — Progress Notes (Signed)
 Surgical Instructions   Your procedure is scheduled on Thursday Oct 02, 2023. Report to [facility] Main Entrance "A" at 5:30 A.M., then check in with the Admitting office. Any questions or running late day of surgery: call [phone]  Questions prior to your surgery date: call [phone], Monday-Friday, 8am-4pm. If you experience any cold or flu symptoms such as cough, fever, chills, shortness of breath, etc. between now and your scheduled surgery, please notify us  at the above number.     Remember:  Do not eat after midnight the night before your surgery  You may drink clear liquids until 4:30 the morning of your surgery.   Clear liquids allowed are: Water, Non-Citrus Juices (without pulp), Carbonated Beverages, Clear Tea (no milk, honey, etc.), Black Coffee Only (NO MILK, CREAM OR POWDERED CREAMER of any kind), and Gatorade.  Patient Instructions  The night before surgery:  No food after midnight. ONLY clear liquids after midnight  The day of surgery (if you have diabetes): Drink ONE (1) 12 oz G2 given to you in your pre admission testing appointment by 4:30 the morning of surgery. Drink in one sitting. Do not sip.  This drink was given to you during your hospital  pre-op appointment visit.  Nothing else to drink after completing the  12 oz bottle of G2.         If you have questions, please contact your surgeon's office.           Take these medicines the morning of surgery with A SIP OF WATER  cetirizine  (ZYRTEC )  omeprazole  (PRILOSEC)   Follow your surgeon's instructions on when to stop Asprin.  If no instructions were given by your surgeon then you will need to call the office to get those instructions.    One week prior to surgery, STOP taking any Aleve, Naproxen, Ibuprofen, Motrin, Advil, Goody's, BC's, all herbal medications, fish oil, and non-prescription vitamins. This includes your meloxicam  (MOBIC ).     WHAT DO I DO ABOUT MY DIABETES MEDICATION?   Do not  take oral diabetes medicines (pills) the morning of surgery.        DO NOT TAKE YOUR metFORMIN  (GLUCOPHAGE ) THE MORNING OF SURGERY.     The day of surgery, do not take other diabetes injectables, including Byetta (exenatide), Bydureon (exenatide ER), Victoza (liraglutide), or Trulicity (dulaglutide).  If your CBG is greater than 220 mg/dL, you may take  of your sliding scale (correction) dose of insulin.   HOW TO MANAGE YOUR DIABETES BEFORE AND AFTER SURGERY  Why is it important to control my blood sugar before and after surgery? Improving blood sugar levels before and after surgery helps healing and can limit problems. A way of improving blood sugar control is eating a healthy diet by:  Eating less sugar and carbohydrates  Increasing activity/exercise  Talking with your doctor about reaching your blood sugar goals High blood sugars (greater than 180 mg/dL) can raise your risk of infections and slow your recovery, so you will need to focus on controlling your diabetes during the weeks before surgery. Make sure that the doctor who takes care of your diabetes knows about your planned surgery including the date and location.  How do I manage my blood sugar before surgery? Check your blood sugar at least 4 times a day, starting 2 days before surgery, to make sure that the level is not too high or low.  Check your blood sugar the morning of your surgery when you wake up and  every 2 hours until you get to the Short Stay unit.  If your blood sugar is less than 70 mg/dL, you will need to treat for low blood sugar: Do not take insulin. Treat a low blood sugar (less than 70 mg/dL) with  cup of clear juice (cranberry or apple), 4 glucose tablets, OR glucose gel. Recheck blood sugar in 15 minutes after treatment (to make sure it is greater than 70 mg/dL). If your blood sugar is not greater than 70 mg/dL on recheck, call [phone] for further instructions. Report your blood sugar to the short  stay nurse when you get to Short Stay.  If you are admitted to the hospital after surgery: Your blood sugar will be checked by the staff and you will probably be given insulin after surgery (instead of oral diabetes medicines) to make sure you have good blood sugar levels. The goal for blood sugar control after surgery is 80-180 mg/dL.                      Do NOT Smoke (Tobacco/Vaping) for 24 hours prior to your procedure.  If you use a CPAP at night, you may bring your mask/headgear for your overnight stay.   You will be asked to remove any contacts, glasses, piercing's, hearing aid's, dentures/partials prior to surgery. Please bring cases for these items if needed.    Patients discharged the day of surgery will not be allowed to drive home, and someone needs to stay with them for 24 hours.  SURGICAL WAITING ROOM VISITATION Patients may have no more than 2 support people in the waiting area - these visitors may rotate.   Pre-op nurse will coordinate an appropriate time for 1 ADULT support person, who may not rotate, to accompany patient in pre-op.  Children under the age of 62 must have an adult with them who is not the patient and must remain in the main waiting area with an adult.  If the patient needs to stay at the hospital during part of their recovery, the visitor guidelines for inpatient rooms apply.  Please refer to the [facility] website for the visitor guidelines for any additional information.   If you received a COVID test during your pre-op visit  it is requested that you wear a mask when out in public, stay away from anyone that may not be feeling well and notify your surgeon if you develop symptoms. If you have been in contact with anyone that has tested positive in the last 10 days please notify you surgeon.      Pre-operative 5 CHG Bathing Instructions   You can play a key role in reducing the risk of infection after surgery. Your skin needs to be as free of germs  as possible. You can reduce the number of germs on your skin by washing with CHG (chlorhexidine gluconate) soap before surgery. CHG is an antiseptic soap that kills germs and continues to kill germs even after washing.   DO NOT use if you have an allergy to chlorhexidine/CHG or antibacterial soaps. If your skin becomes reddened or irritated, stop using the CHG and notify one of our RNs at [phone].   Please shower with the CHG soap starting 4 days before surgery using the following schedule:     Please keep in mind the following:  DO NOT shave, including legs and underarms, starting the day of your first shower.   You may shave your face at any point before/day of  surgery.  Place clean sheets on your bed the day you start using CHG soap. Use a clean washcloth (not used since being washed) for each shower. DO NOT sleep with pets once you start using the CHG.   CHG Shower Instructions:  Wash your face and private area with normal soap. If you choose to wash your hair, wash first with your normal shampoo.  After you use shampoo/soap, rinse your hair and body thoroughly to remove shampoo/soap residue.  Turn the water OFF and apply about 3 tablespoons (45 ml) of CHG soap to a CLEAN washcloth.  Apply CHG soap ONLY FROM YOUR NECK DOWN TO YOUR TOES (washing for 3-5 minutes)  DO NOT use CHG soap on face, private areas, open wounds, or sores.  Pay special attention to the area where your surgery is being performed.  If you are having back surgery, having someone wash your back for you may be helpful. Wait 2 minutes after CHG soap is applied, then you may rinse off the CHG soap.  Pat dry with a clean towel  Put on clean clothes/pajamas   If you choose to wear lotion, please use ONLY the CHG-compatible lotions that are listed below.  Additional instructions for the day of surgery: DO NOT APPLY any lotions, deodorant or cologne   Do not bring valuables to the hospital. [facility] is not  responsible for any belongings/valuables. Do not wear jewelry  Put on clean/comfortable clothes.  Please brush your teeth.  Ask your nurse before applying any prescription medications to the skin.     CHG Compatible Lotions   Aveeno Moisturizing lotion  Cetaphil Moisturizing Cream  Cetaphil Moisturizing Lotion  Clairol Herbal Essence Moisturizing Lotion, Dry Skin  Clairol Herbal Essence Moisturizing Lotion, Extra Dry Skin  Clairol Herbal Essence Moisturizing Lotion, Normal Skin  Curel Age Defying Therapeutic Moisturizing Lotion with Alpha Hydroxy  Curel Extreme Care Body Lotion  Curel Soothing Hands Moisturizing Hand Lotion  Curel Therapeutic Moisturizing Cream, Fragrance-Free  Curel Therapeutic Moisturizing Lotion, Fragrance-Free  Curel Therapeutic Moisturizing Lotion, Original Formula  Eucerin Daily Replenishing Lotion  Eucerin Dry Skin Therapy Plus Alpha Hydroxy Crme  Eucerin Dry Skin Therapy Plus Alpha Hydroxy Lotion  Eucerin Original Crme  Eucerin Original Lotion  Eucerin Plus Crme Eucerin Plus Lotion  Eucerin TriLipid Replenishing Lotion  Keri Anti-Bacterial Hand Lotion  Keri Deep Conditioning Original Lotion Dry Skin Formula Softly Scented  Keri Deep Conditioning Original Lotion, Fragrance Free Sensitive Skin Formula  Keri Lotion Fast Absorbing Fragrance Free Sensitive Skin Formula  Keri Lotion Fast Absorbing Softly Scented Dry Skin Formula  Keri Original Lotion  Keri Skin Renewal Lotion Keri Silky Smooth Lotion  Keri Silky Smooth Sensitive Skin Lotion  Nivea Body Creamy Conditioning Oil  Nivea Body Extra Enriched Lotion  Nivea Body Original Lotion  Nivea Body Sheer Moisturizing Lotion Nivea Crme  Nivea Skin Firming Lotion  NutraDerm 30 Skin Lotion  NutraDerm Skin Lotion  NutraDerm Therapeutic Skin Cream  NutraDerm Therapeutic Skin Lotion  ProShield Protective Hand Cream  Provon moisturizing lotion  Please read over the following fact sheets that you were  given.

## 2023-09-19 ENCOUNTER — Ambulatory Visit (HOSPITAL_COMMUNITY)
Admission: RE | Admit: 2023-09-19 | Discharge: 2023-09-19 | Disposition: A | Discharge status: Home / Self Care | Source: Ambulatory Visit | Attending: Orthopedic Surgery | Admitting: Orthopedic Surgery

## 2023-09-19 ENCOUNTER — Encounter (HOSPITAL_COMMUNITY): Payer: Self-pay

## 2023-09-19 ENCOUNTER — Encounter (HOSPITAL_COMMUNITY)
Admission: RE | Admit: 2023-09-19 | Discharge: 2023-09-19 | Disposition: A | Discharge status: Home / Self Care | Source: Ambulatory Visit | Attending: Specialist | Admitting: Specialist

## 2023-09-19 ENCOUNTER — Other Ambulatory Visit: Payer: Self-pay

## 2023-09-19 VITALS — BP 129/93 | HR 101 | Temp 97.7°F | Resp 19 | Ht 70.0 in | Wt 224.7 lb

## 2023-09-19 DIAGNOSIS — Z01818 Encounter for other preprocedural examination: Secondary | ICD-10-CM | POA: Diagnosis present

## 2023-09-19 DIAGNOSIS — M5126 Other intervertebral disc displacement, lumbar region: Secondary | ICD-10-CM | POA: Insufficient documentation

## 2023-09-19 HISTORY — DX: Essential (primary) hypertension: I10

## 2023-09-19 LAB — SURGICAL PCR SCREEN
MRSA, PCR: NEGATIVE
Staphylococcus aureus: NEGATIVE

## 2023-09-19 LAB — GLUCOSE, CAPILLARY: Glucose-Capillary: 117 mg/dL — ABNORMAL HIGH (ref 70–99)

## 2023-09-19 NOTE — Progress Notes (Signed)
 PCP - Dr. Eliane Grooms Cardiologist - denies  PPM/ICD - denies   Lumbar x-ray - 09/19/23 EKG - 09/19/23 Stress Test - denies ECHO - denies Cardiac Cath - denies  Sleep Study - OSA+ and wears CPAP  Patient does not check blood sugar at home.    Blood Thinner Instructions: n/a Aspirin Instructions: patient states that he will stop his Aspirin 7 days prior to surgery  ERAS Protcol - clears until 0430 PRE-SURGERY Ensure or G2-  G2 as ordered  COVID TEST- n/a   Anesthesia review: no  Patient denies shortness of breath, fever, cough and chest pain at PAT appointment   All instructions explained to the patient, with a verbal understanding of the material. Patient agrees to go over the instructions while at home for a better understanding. Patient also instructed to self quarantine after being tested for COVID-19. The opportunity to ask questions was provided.

## 2023-10-02 ENCOUNTER — Encounter (HOSPITAL_COMMUNITY): Payer: Self-pay | Admitting: Specialist

## 2023-10-02 ENCOUNTER — Ambulatory Visit (HOSPITAL_COMMUNITY)

## 2023-10-02 ENCOUNTER — Other Ambulatory Visit: Payer: Self-pay

## 2023-10-02 ENCOUNTER — Ambulatory Visit (HOSPITAL_BASED_OUTPATIENT_CLINIC_OR_DEPARTMENT_OTHER): Admitting: Certified Registered Nurse Anesthetist

## 2023-10-02 ENCOUNTER — Ambulatory Visit (HOSPITAL_COMMUNITY)
Admission: RE | Disposition: A | Discharge status: Home / Self Care | Payer: Self-pay | Source: Home / Self Care | Attending: Specialist

## 2023-10-02 ENCOUNTER — Ambulatory Visit (HOSPITAL_COMMUNITY)
Admission: RE | Admit: 2023-10-02 | Discharge: 2023-10-02 | Disposition: A | Discharge status: Home / Self Care | Attending: Specialist | Admitting: Specialist

## 2023-10-02 ENCOUNTER — Ambulatory Visit (HOSPITAL_COMMUNITY): Admitting: Certified Registered Nurse Anesthetist

## 2023-10-02 DIAGNOSIS — M48061 Spinal stenosis, lumbar region without neurogenic claudication: Secondary | ICD-10-CM | POA: Diagnosis present

## 2023-10-02 DIAGNOSIS — M5116 Intervertebral disc disorders with radiculopathy, lumbar region: Secondary | ICD-10-CM

## 2023-10-02 DIAGNOSIS — M419 Scoliosis, unspecified: Secondary | ICD-10-CM | POA: Diagnosis not present

## 2023-10-02 DIAGNOSIS — E119 Type 2 diabetes mellitus without complications: Secondary | ICD-10-CM

## 2023-10-02 DIAGNOSIS — I10 Essential (primary) hypertension: Secondary | ICD-10-CM

## 2023-10-02 DIAGNOSIS — Z7984 Long term (current) use of oral hypoglycemic drugs: Secondary | ICD-10-CM | POA: Insufficient documentation

## 2023-10-02 DIAGNOSIS — K219 Gastro-esophageal reflux disease without esophagitis: Secondary | ICD-10-CM | POA: Insufficient documentation

## 2023-10-02 DIAGNOSIS — G4730 Sleep apnea, unspecified: Secondary | ICD-10-CM | POA: Diagnosis not present

## 2023-10-02 DIAGNOSIS — Z01818 Encounter for other preprocedural examination: Secondary | ICD-10-CM

## 2023-10-02 HISTORY — PX: LUMBAR LAMINECTOMY/DECOMPRESSION MICRODISCECTOMY: SHX5026

## 2023-10-02 LAB — GLUCOSE, CAPILLARY
Glucose-Capillary: 112 mg/dL — ABNORMAL HIGH (ref 70–99)
Glucose-Capillary: 111 mg/dL — ABNORMAL HIGH (ref 70–99)

## 2023-10-02 SURGERY — LUMBAR LAMINECTOMY/DECOMPRESSION MICRODISCECTOMY 1 LEVEL
Anesthesia: General | Laterality: Left

## 2023-10-02 MED ORDER — ACETAMINOPHEN 325 MG PO TABS: 650.0000 mg | ORAL_TABLET | ORAL | Status: DC | PRN

## 2023-10-02 MED ORDER — ACETAMINOPHEN 10 MG/ML IV SOLN: 1000.0000 mg | Freq: Once | INTRAVENOUS | Status: DC | PRN

## 2023-10-02 MED ORDER — LIDOCAINE 2% (20 MG/ML) 5 ML SYRINGE
INTRAMUSCULAR | Status: AC
Start: 2023-10-02 — End: ?
  Filled 2023-10-02: qty 5

## 2023-10-02 MED ORDER — PHENYLEPHRINE 80 MCG/ML (10ML) SYRINGE FOR IV PUSH (FOR BLOOD PRESSURE SUPPORT)
PREFILLED_SYRINGE | INTRAVENOUS | Status: DC | PRN
  Administered 2023-10-02 (×6): 80 ug via INTRAVENOUS

## 2023-10-02 MED ORDER — ALUM & MAG HYDROXIDE-SIMETH 200-200-20 MG/5ML PO SUSP: 30.0000 mL | Freq: Four times a day (QID) | ORAL | Status: DC | PRN

## 2023-10-02 MED ORDER — FENTANYL CITRATE (PF) 250 MCG/5ML IJ SOLN
INTRAMUSCULAR | Status: AC
  Filled 2023-10-02: qty 5

## 2023-10-02 MED ORDER — RISAQUAD PO CAPS: 1.0000 | ORAL_CAPSULE | Freq: Every day | ORAL | Status: DC

## 2023-10-02 MED ORDER — MIDAZOLAM HCL 2 MG/2ML IJ SOLN
INTRAMUSCULAR | Status: AC
  Filled 2023-10-02: qty 2

## 2023-10-02 MED ORDER — METHOCARBAMOL 500 MG PO TABS: 500.0000 mg | ORAL_TABLET | Freq: Four times a day (QID) | ORAL | Status: DC | PRN

## 2023-10-02 MED ORDER — LACTATED RINGERS IV SOLN: INTRAVENOUS | Status: DC

## 2023-10-02 MED ORDER — MIDAZOLAM HCL 2 MG/2ML IJ SOLN
INTRAMUSCULAR | Status: DC | PRN
  Administered 2023-10-02: 2 mg via INTRAVENOUS

## 2023-10-02 MED ORDER — MAGNESIUM CITRATE PO SOLN: 1.0000 | Freq: Once | ORAL | Status: DC | PRN

## 2023-10-02 MED ORDER — MENTHOL 3 MG MT LOZG: 1.0000 | LOZENGE | OROMUCOSAL | Status: DC | PRN

## 2023-10-02 MED ORDER — SUGAMMADEX SODIUM 200 MG/2ML IV SOLN
INTRAVENOUS | Status: DC | PRN
  Administered 2023-10-02: 200 mg via INTRAVENOUS

## 2023-10-02 MED ORDER — BUPIVACAINE-EPINEPHRINE 0.5% -1:200000 IJ SOLN
INTRAMUSCULAR | Status: DC | PRN
  Administered 2023-10-02: 3 mL

## 2023-10-02 MED ORDER — 0.9 % SODIUM CHLORIDE (POUR BTL) OPTIME
TOPICAL | Status: DC | PRN
  Administered 2023-10-02: 1000 mL

## 2023-10-02 MED ORDER — THROMBIN 20000 UNITS EX KIT
PACK | CUTANEOUS | Status: AC
  Filled 2023-10-02: qty 1

## 2023-10-02 MED ORDER — METHOCARBAMOL 500 MG PO TABS: 500.0000 mg | ORAL_TABLET | Freq: Three times a day (TID) | ORAL | 1 refills | Status: AC | PRN

## 2023-10-02 MED ORDER — METHOCARBAMOL 1000 MG/10ML IJ SOLN
INTRAMUSCULAR | Status: DC | PRN
  Administered 2023-10-02: 1000 mg via INTRAVENOUS

## 2023-10-02 MED ORDER — DEXAMETHASONE SODIUM PHOSPHATE 10 MG/ML IJ SOLN
INTRAMUSCULAR | Status: DC | PRN
  Administered 2023-10-02: 10 mg via INTRAVENOUS

## 2023-10-02 MED ORDER — BISACODYL 5 MG PO TBEC: 5.0000 mg | DELAYED_RELEASE_TABLET | Freq: Every day | ORAL | Status: DC | PRN

## 2023-10-02 MED ORDER — POTASSIUM CHLORIDE IN NACL 20-0.9 MEQ/L-% IV SOLN
INTRAVENOUS | Status: DC
  Filled 2023-10-02: qty 1000

## 2023-10-02 MED ORDER — ONDANSETRON HCL 4 MG/2ML IJ SOLN: 4.0000 mg | Freq: Once | INTRAMUSCULAR | Status: DC | PRN

## 2023-10-02 MED ORDER — PHENYLEPHRINE HCL-NACL 20-0.9 MG/250ML-% IV SOLN
INTRAVENOUS | Status: DC | PRN
Start: 2023-10-02 — End: 2023-10-02
  Administered 2023-10-02: 15 ug/min via INTRAVENOUS

## 2023-10-02 MED ORDER — OXYCODONE HCL 5 MG PO TABS: 5.0000 mg | ORAL_TABLET | ORAL | 0 refills | Status: AC | PRN

## 2023-10-02 MED ORDER — CEFAZOLIN SODIUM-DEXTROSE 2-4 GM/100ML-% IV SOLN
2.0000 g | Freq: Three times a day (TID) | INTRAVENOUS | Status: DC
  Administered 2023-10-02: 2 g via INTRAVENOUS
  Filled 2023-10-02: qty 100

## 2023-10-02 MED ORDER — HYDROCHLOROTHIAZIDE 25 MG PO TABS
25.0000 mg | ORAL_TABLET | Freq: Every day | ORAL | Status: DC
Start: 2023-10-02 — End: 2023-10-02

## 2023-10-02 MED ORDER — CEFAZOLIN SODIUM-DEXTROSE 2-4 GM/100ML-% IV SOLN
2.0000 g | INTRAVENOUS | Status: AC
  Administered 2023-10-02: 2 g via INTRAVENOUS
  Filled 2023-10-02: qty 100

## 2023-10-02 MED ORDER — LORATADINE 10 MG PO TABS: 10.0000 mg | ORAL_TABLET | Freq: Every day | ORAL | Status: DC

## 2023-10-02 MED ORDER — PROPOFOL 10 MG/ML IV BOLUS
INTRAVENOUS | Status: DC | PRN
  Administered 2023-10-02: 150 mg via INTRAVENOUS

## 2023-10-02 MED ORDER — PROPOFOL 10 MG/ML IV BOLUS
INTRAVENOUS | Status: AC
  Filled 2023-10-02: qty 20

## 2023-10-02 MED ORDER — ROCURONIUM BROMIDE 10 MG/ML (PF) SYRINGE
PREFILLED_SYRINGE | INTRAVENOUS | Status: AC
  Filled 2023-10-02: qty 10

## 2023-10-02 MED ORDER — LISINOPRIL 20 MG PO TABS
20.0000 mg | ORAL_TABLET | Freq: Every day | ORAL | Status: DC
Start: 2023-10-03 — End: 2023-10-02

## 2023-10-02 MED ORDER — PANTOPRAZOLE SODIUM 40 MG PO TBEC: 40.0000 mg | DELAYED_RELEASE_TABLET | Freq: Every day | ORAL | Status: DC

## 2023-10-02 MED ORDER — OXYCODONE HCL 5 MG/5ML PO SOLN: 5.0000 mg | Freq: Once | ORAL | Status: DC | PRN

## 2023-10-02 MED ORDER — METHOCARBAMOL 1000 MG/10ML IJ SOLN: 500.0000 mg | Freq: Four times a day (QID) | INTRAMUSCULAR | Status: DC | PRN

## 2023-10-02 MED ORDER — FENTANYL CITRATE (PF) 250 MCG/5ML IJ SOLN
INTRAMUSCULAR | Status: DC | PRN
  Administered 2023-10-02 (×3): 50 ug via INTRAVENOUS
  Administered 2023-10-02: 100 ug via INTRAVENOUS

## 2023-10-02 MED ORDER — PHENYLEPHRINE 80 MCG/ML (10ML) SYRINGE FOR IV PUSH (FOR BLOOD PRESSURE SUPPORT)
PREFILLED_SYRINGE | INTRAVENOUS | Status: AC
  Filled 2023-10-02: qty 10

## 2023-10-02 MED ORDER — DOCUSATE SODIUM 100 MG PO CAPS: 100.0000 mg | ORAL_CAPSULE | Freq: Two times a day (BID) | ORAL | 2 refills | Status: AC

## 2023-10-02 MED ORDER — DOCUSATE SODIUM 100 MG PO CAPS: 100.0000 mg | ORAL_CAPSULE | Freq: Two times a day (BID) | ORAL | Status: DC

## 2023-10-02 MED ORDER — ACETAMINOPHEN 650 MG RE SUPP: 650.0000 mg | RECTAL | Status: DC | PRN

## 2023-10-02 MED ORDER — ONDANSETRON HCL 4 MG PO TABS: 4.0000 mg | ORAL_TABLET | Freq: Four times a day (QID) | ORAL | Status: DC | PRN

## 2023-10-02 MED ORDER — HYDROMORPHONE HCL 1 MG/ML IJ SOLN: 1.0000 mg | INTRAMUSCULAR | Status: DC | PRN

## 2023-10-02 MED ORDER — THROMBIN 20000 UNITS EX SOLR
CUTANEOUS | Status: DC | PRN
  Administered 2023-10-02: 4 mL via TOPICAL

## 2023-10-02 MED ORDER — BUPIVACAINE-EPINEPHRINE (PF) 0.5% -1:200000 IJ SOLN
INTRAMUSCULAR | Status: AC
  Filled 2023-10-02: qty 30

## 2023-10-02 MED ORDER — PHENOL 1.4 % MT LIQD: 1.0000 | OROMUCOSAL | Status: DC | PRN

## 2023-10-02 MED ORDER — ORAL CARE MOUTH RINSE: 15.0000 mL | Freq: Once | OROMUCOSAL | Status: AC

## 2023-10-02 MED ORDER — ONDANSETRON HCL 4 MG/2ML IJ SOLN: 4.0000 mg | Freq: Four times a day (QID) | INTRAMUSCULAR | Status: DC | PRN

## 2023-10-02 MED ORDER — CHLORHEXIDINE GLUCONATE 0.12 % MT SOLN
15.0000 mL | Freq: Once | OROMUCOSAL | Status: AC
  Administered 2023-10-02: 15 mL via OROMUCOSAL
  Filled 2023-10-02: qty 15

## 2023-10-02 MED ORDER — TRANEXAMIC ACID-NACL 1000-0.7 MG/100ML-% IV SOLN
1000.0000 mg | INTRAVENOUS | Status: AC
  Administered 2023-10-02: 1000 mg via INTRAVENOUS
  Filled 2023-10-02: qty 100

## 2023-10-02 MED ORDER — INSULIN ASPART 100 UNIT/ML IJ SOLN: 0.0000 [IU] | INTRAMUSCULAR | Status: DC | PRN

## 2023-10-02 MED ORDER — INSULIN ASPART 100 UNIT/ML IJ SOLN: 0.0000 [IU] | Freq: Three times a day (TID) | INTRAMUSCULAR | Status: DC

## 2023-10-02 MED ORDER — POLYETHYLENE GLYCOL 3350 17 G PO PACK: 17.0000 g | PACK | Freq: Every day | ORAL | Status: DC | PRN

## 2023-10-02 MED ORDER — OXYCODONE HCL 5 MG PO TABS: 5.0000 mg | ORAL_TABLET | Freq: Once | ORAL | Status: DC | PRN

## 2023-10-02 MED ORDER — OXYCODONE HCL 5 MG PO TABS: 10.0000 mg | ORAL_TABLET | ORAL | Status: DC | PRN

## 2023-10-02 MED ORDER — ACETAMINOPHEN 10 MG/ML IV SOLN
1000.0000 mg | INTRAVENOUS | Status: AC
  Administered 2023-10-02: 1000 mg via INTRAVENOUS
  Filled 2023-10-02: qty 100

## 2023-10-02 MED ORDER — POLYETHYLENE GLYCOL 3350 17 G PO PACK: 17.0000 g | PACK | Freq: Every day | ORAL | 0 refills | Status: AC

## 2023-10-02 MED ORDER — ROCURONIUM BROMIDE 10 MG/ML (PF) SYRINGE
PREFILLED_SYRINGE | INTRAVENOUS | Status: DC | PRN
  Administered 2023-10-02: 60 mg via INTRAVENOUS

## 2023-10-02 MED ORDER — ONDANSETRON HCL 4 MG/2ML IJ SOLN
INTRAMUSCULAR | Status: DC | PRN
  Administered 2023-10-02: 4 mg via INTRAVENOUS

## 2023-10-02 MED ORDER — LIDOCAINE HCL (CARDIAC) PF 100 MG/5ML IV SOSY
PREFILLED_SYRINGE | INTRAVENOUS | Status: DC | PRN
  Administered 2023-10-02: 100 mg via INTRAVENOUS

## 2023-10-02 MED ORDER — FENTANYL CITRATE (PF) 100 MCG/2ML IJ SOLN: 25.0000 ug | INTRAMUSCULAR | Status: DC | PRN

## 2023-10-02 MED ORDER — OXYCODONE HCL 5 MG PO TABS: 5.0000 mg | ORAL_TABLET | ORAL | Status: DC | PRN

## 2023-10-02 SURGICAL SUPPLY — 50 items
BAG COUNTER SPONGE SURGICOUNT (BAG) ×2 IMPLANT
BAG DECANTER FOR FLEXI CONT (MISCELLANEOUS) IMPLANT
BAND RUBBER #18 3X1/16 STRL (MISCELLANEOUS) ×4 IMPLANT
BENZOIN TINCTURE PRP APPL 2/3 (GAUZE/BANDAGES/DRESSINGS) IMPLANT
BUR CUTTER 5.0 COARSE DIAMOND (BURR) IMPLANT
BUR EGG ELITE 5.0 (BURR) IMPLANT
BUR RND DIAMOND ELITE 4.0 (BURR) IMPLANT
CLEANER TIP ELECTROSURG 2X2 (MISCELLANEOUS) ×2 IMPLANT
CNTNR URN SCR LID CUP LEK RST (MISCELLANEOUS) ×2 IMPLANT
DRAPE LAPAROTOMY 100X72X124 (DRAPES) ×2 IMPLANT
DRAPE MICROSCOPE SLANT 54X150 (MISCELLANEOUS) ×2 IMPLANT
DRAPE SHEET LG 3/4 BI-LAMINATE (DRAPES) ×2 IMPLANT
DRAPE SURG 17X11 SM STRL (DRAPES) ×2 IMPLANT
DRAPE UTILITY XL STRL (DRAPES) ×2 IMPLANT
DRSG AQUACEL AG ADV 3.5X 4 (GAUZE/BANDAGES/DRESSINGS) IMPLANT
DRSG AQUACEL AG ADV 3.5X 6 (GAUZE/BANDAGES/DRESSINGS) IMPLANT
DRSG TELFA 3X8 NADH STRL (GAUZE/BANDAGES/DRESSINGS) IMPLANT
DURAPREP 26ML APPLICATOR (WOUND CARE) ×2 IMPLANT
DURASEAL SPINE SEALANT 3ML (MISCELLANEOUS) IMPLANT
ELECTRODE BLDE 4.0 EZ CLN MEGD (MISCELLANEOUS) IMPLANT
ELECTRODE REM PT RTRN 9FT ADLT (ELECTROSURGICAL) ×2 IMPLANT
GLOVE BIOGEL PI IND STRL 7.5 (GLOVE) ×2 IMPLANT
GLOVE SURG SS PI 7.0 STRL IVOR (GLOVE) ×2 IMPLANT
GLOVE SURG SS PI 8.0 STRL IVOR (GLOVE) ×4 IMPLANT
GOWN STRL REUS W/ TWL LRG LVL3 (GOWN DISPOSABLE) ×2 IMPLANT
GOWN STRL REUS W/ TWL XL LVL3 (GOWN DISPOSABLE) ×2 IMPLANT
IV CATH 14GX2 1/4 (CATHETERS) ×2 IMPLANT
KIT BASIN OR (CUSTOM PROCEDURE TRAY) ×2 IMPLANT
NDL 22X1.5 STRL (OR ONLY) (MISCELLANEOUS) ×2 IMPLANT
NDL SPNL 18GX3.5 QUINCKE PK (NEEDLE) ×4 IMPLANT
NEEDLE 22X1.5 STRL (OR ONLY) (MISCELLANEOUS) ×1 IMPLANT
NEEDLE SPNL 18GX3.5 QUINCKE PK (NEEDLE) ×2 IMPLANT
PACK LAMINECTOMY NEURO (CUSTOM PROCEDURE TRAY) ×2 IMPLANT
PATTIES SURGICAL .75X.75 (GAUZE/BANDAGES/DRESSINGS) ×2 IMPLANT
SOLUTION PRONTOSAN WOUND 350ML (IRRIGATION / IRRIGATOR) IMPLANT
SPONGE SURGIFOAM ABS GEL 100 (HEMOSTASIS) ×2 IMPLANT
SPONGE T-LAP 4X18 ~~LOC~~+RFID (SPONGE) IMPLANT
STAPLER VISISTAT (STAPLE) IMPLANT
STRIP CLOSURE SKIN 1/2X4 (GAUZE/BANDAGES/DRESSINGS) ×2 IMPLANT
SUT NURALON 4 0 TR CR/8 (SUTURE) IMPLANT
SUT PROLENE 3 0 PS 2 (SUTURE) IMPLANT
SUT VIC AB 1-0 CT2 27 (SUTURE) IMPLANT
SUT VIC AB 2-0 CT1 TAPERPNT 27 (SUTURE) IMPLANT
SUT VIC AB 2-0 CT2 27 (SUTURE) IMPLANT
SYR 3ML LL SCALE MARK (SYRINGE) ×2 IMPLANT
TOWEL GREEN STERILE (TOWEL DISPOSABLE) ×2 IMPLANT
TOWEL GREEN STERILE FF (TOWEL DISPOSABLE) ×2 IMPLANT
TRAY FOLEY MTR SLVR 16FR STAT (SET/KITS/TRAYS/PACK) ×2 IMPLANT
WIPE CHG 2% 2PK PREOPERATIVE (MISCELLANEOUS) ×2 IMPLANT
YANKAUER SUCT BULB TIP NO VENT (SUCTIONS) ×2 IMPLANT

## 2023-10-02 NOTE — Plan of Care (Signed)
 Pt doing well. Pt and wife given D/C instructions with verbal understanding. Rx's were sent to the pharmacy by MD. Pt's incision is clean and dry with no sign of infection. Pt's IV was removed prior to D/C. Pt D/C'd home via wheelchair per MD order. Pt is stable @ D/C and has no other needs at this time. Rema Fendt, RN

## 2023-10-02 NOTE — Anesthesia Postprocedure Evaluation (Signed)
 Anesthesia Post Note  Patient: Chad [name]  Procedure(s) Performed: LUMBAR LAMINECTOMY/DECOMPRESSION MICRODISCECTOMY ONE LEVEL (Left)     Patient location during evaluation: PACU Anesthesia Type: General Level of consciousness: awake and alert Pain management: pain level controlled Vital Signs Assessment: post-procedure vital signs reviewed and stable Respiratory status: spontaneous breathing, nonlabored ventilation, respiratory function stable and patient connected to nasal cannula oxygen Cardiovascular status: blood pressure returned to baseline and stable Postop Assessment: no apparent nausea or vomiting Anesthetic complications: no   No notable events documented.  Last Vitals:  Vitals:   10/02/23 1030 10/02/23 1056  BP: 114/78 (!) 113/91  Pulse: 72 71  Resp: 18 18  Temp: 36.8 C   SpO2: 99% 100%    Last Pain:  Vitals:   10/02/23 1100  TempSrc:   PainSc: 3                  Leslye Rast

## 2023-10-02 NOTE — Op Note (Signed)
 Chad, [name] MEDICAL RECORD NO: [id number] ACCOUNT NO: [id number] DATE OF BIRTH: 01/15/1961 FACILITY: [facility] LOCATION: [facility] PHYSICIAN: Loel Ring, MD  Operative Report   DATE OF PROCEDURE: 10/02/2023  PREOPERATIVE DIAGNOSES: 1.  Spinal stenosis, HNP, L4-L5, left. 2.  Scoliosis.  POSTOPERATIVE DIAGNOSES: 1.  Spinal stenosis, L4-L5, left. 2.  Herniated disc, L4-L5, left. 3.  Extensive epidural venous plexus, L4-L5, left. 4.  Scoliosis.  PROCEDURE PERFORMED: 1.  Hemilaminotomy with partial medial hemifacetectomies with foraminotomies, L4-L5, left. 2.  Lysis of epidural venous plexus, extensive, L4-L5, left. 3.  Microdiscectomy, L4-L5, left.  ANESTHESIA: General.  ASSISTANT:  Amy Kansky, PA-C.  HISTORY:  A 63 year old male with L5 radiculopathy, myotomal weakness, dermatomal dysesthesias, dorsiflexion weakness.  The patient was indicated for decompression of the L5 nerve root due to a disc herniation and stenosis noted in the lateral recess  with underlying scoliosis.  Risks and benefits discussed including bleeding, infection, damage to neurovascular structures, no change in symptoms, worsening symptoms, DVT, PE, anesthetic complications, etc.  DESCRIPTION OF PROCEDURE:  The patient in the supine position after induction of adequate general anesthesia and 2 g of Kefzol placed prone on the Wilson frame.  All bony prominences were well padded.  Lumbar region was prepped and draped in the usual  sterile fashion.  Two 18-gauge spinal needles were utilized to localize the L4-L5 interspace, confirmed with x-ray.  Incision was made from the spinous process of L4 to L5.  Subcutaneous tissue was dissected.  Electrocautery was utilized to achieve  hemostasis.  Dorsal lumbar fascia was divided inline with the skin incision.  Paraspinous muscle was elevated from lamina of L4-L5.  A McCulloch retractor was placed.  The operating microscope was draped and brought onto the  surgical field.  Confirmatory  radiographs were obtained at L4-L5.  First hemilaminotomy of the caudad edge of L4 was performed with a Leksell rongeur as well as a high-speed burr to the point cephalad.  Above the disc space, further completed with a 2-3 mm Kerrison to the point of  detachment of the ligamentum flavum.  Ligamentum flavum was detached from the cephalad edge of L5 utilizing a microcurette.  Ligamentum flavum was gently removed from the interspace with a neural patty placed beneath it.  A L5 nerve root was found to be  compressed into the lateral recess significantly.  It was gently mobilized medially, and there was extensive epidural venous plexus tethering the L5 root extending out into the foramen of L5, also tethering the shoulder of the L5 root to the L4 root.   Foraminotomy of L5 was performed as was L4, both stenotic.  We extensively isolated, cauterized, and divided the venous plexus both distally and proximally, mobilizing the L5 root and the L4 root further.  This allowed us  to gain access to the disc space  with a focal disc herniation there as well as an extruded fragment caudad.  The extruded fragment caudad was removed with a pituitary.  I decompressed the lateral recess to the medial border of the pedicle with a 2 mm Kerrison.  Bone wax was placed on  the cancellous surfaces.  Focal HNP was noted at disk space and annulotomy was performed.  A copious portion of the disc material was removed from the disc space with a micropituitary straight and upbiting rongeur.  Further mobilized with catheter lavage  and distal fragments were excised.  Following this decompression, there was 1 cm of excursion of the L5 root medial to the  pedicle without tension, and a Woodson probe passed freely at the foramen of L4 and L5, above to the pedicle of L4 and below the  pedicle of L5.  A confirmatory radiograph was obtained with a Penfield in the disc space.  Copiously irrigated once again.   Thrombin soaked Gelfoam was placed in the laminotomy defect.  It was then excised.  Additional veins were cauterized.  No evidence  of active bleeding or CSF leakage was noted.  Therefore we removed the McCulloch retractor irrigated the paraspinous musculature and achieved strict hemostasis with bipolar cautery.  Dorsal lumbar fascia was reapproximated with 1-0 Vicryl interrupted  figure-of-eight sutures, subcuticular tissue with 2-0 Vicryl, and skin with subcuticular Prolene.  A sterile dressing was applied.  The patient was placed supine in the hospital bed, extubated without difficulty, and transported to the recovery room in  satisfactory condition.  The patient tolerated the procedure well with no complications.  Assistant, Jaqueline Bissell, PA was used throughout the case for patient positioning, gentle intermittent neural traction, suction, and closure.  ESTIMATED BLOOD LOSS:  20 mL.  Technical difficulty was increased due to the extensive epidural venous plexus tethering the 5 root as well as the patient's scoliosis with rotation of the spine.   PUS D: 10/02/2023 9:46:07 am T: 10/02/2023 1:39:00 pm  JOB: [id number]/ [id number]

## 2023-10-02 NOTE — Brief Op Note (Signed)
 10/02/2023  7:16 AM  PATIENT:  Chad [name]  63 y.o. male  PRE-OPERATIVE DIAGNOSIS:  Lumbar radiculopathy  POST-OPERATIVE DIAGNOSIS:  * No post-op diagnosis entered *  PROCEDURE:  Procedure(s) with comments: LUMBAR LAMINECTOMY/DECOMPRESSION MICRODISCECTOMY 1 LEVEL (Left) - Left hemi laminotomy, microdiscectomy L4-L5  SURGEON:  Surgeons and Role:    Orvan Blanch, MD - Primary  PHYSICIAN ASSISTANT:   ASSISTANTS: Bissell   ANESTHESIA:   general  EBL:  20   BLOOD ADMINISTERED:none  DRAINS: none   LOCAL MEDICATIONS USED:  MARCAINE     SPECIMEN:  No Specimen  DISPOSITION OF SPECIMEN:  N/A  COUNTS:  YES  TOURNIQUET:  * No tourniquets in log *  DICTATION: .Other Dictation: Dictation Number [id number]   PLAN OF CARE: Admit for overnight observation  PATIENT DISPOSITION:  PACU - hemodynamically stable.   Delay start of Pharmacological VTE agent (>24hrs) due to surgical blood loss or risk of bleeding: yes

## 2023-10-02 NOTE — Evaluation (Signed)
 Physical Therapy Evaluation and Discharge Patient Details Name: Chad [name] MRN: [id number] DOB: 15-Apr-1961 Today's Date: 10/02/2023  History of Present Illness  Pt is a 63 y/o male who presents s/p Left hemi laminotomy, microdiscectomy L4-L5 on 10/01/2023.  PMH significant for DM II.  Clinical Impression  Patient evaluated by Physical Therapy with no further acute PT needs identified. All education has been completed and the patient has no further questions. Pt was able to demonstrate transfers and ambulation with gross modified independence with occasional CGA during gait training due to mild unsteadiness. Pt was educated on precautions, positioning recommendations, appropriate activity progression, and car transfer. See below for any follow-up Physical Therapy or equipment needs. PT is signing off. Thank you for this referral.         If plan is discharge home, recommend the following:     Can travel by private vehicle        Equipment Recommendations None recommended by PT  Recommendations for Other Services       Functional Status Assessment       Precautions / Restrictions Precautions Precautions: Back Precaution Booklet Issued: Yes (comment) Recall of Precautions/Restrictions: Intact Precaution/Restrictions Comments: Reviewed handout and pt was cued for precautions during functional mobility. Required Braces or Orthoses:  (No brace needed order) Restrictions Weight Bearing Restrictions Per Provider Order: No      Mobility  Bed Mobility     Rolling: Modified independent (Device/Increase time)              Transfers Overall transfer level: Modified independent Equipment used: None               General transfer comment: No UE use to stand. Pt maintained good posture. Cues for wide BOS when squatting to sit.    Ambulation/Gait Ambulation/Gait assistance: Supervision, Contact guard assist Gait Distance (Feet): 300 Feet Assistive device: None Gait  Pattern/deviations: Step-through pattern, Decreased stride length, Drifts right/left, Staggering right       General Gait Details: Grossly ambulating well without difficulty, however pt with x2 mild bouts of unsteadiness and required CGA to steady. Pt able to recover well.  Stairs Stairs: Yes Stairs assistance: Supervision Stair Management: One rail Right, Alternating pattern, Forwards Number of Stairs: 10 General stair comments: Pt demonstrated good sequencing and without evidence of knee buckling or LOB.  Wheelchair Mobility     Tilt Bed    Modified Rankin (Stroke Patients Only)       Balance   Sitting-balance support: Feet supported, No upper extremity supported Sitting balance-Leahy Scale: Good     Standing balance support: No upper extremity supported, During functional activity Standing balance-Leahy Scale: Poor Standing balance comment: x2 instances of unsteadiness                             Pertinent Vitals/Pain Pain Assessment Pain Assessment: Faces Faces Pain Scale: Hurts a little bit Pain Location: L hip, back Pain Descriptors / Indicators: Operative site guarding, Sore Pain Intervention(s): Limited activity within patient's tolerance, Monitored during session, Repositioned    Home Living Family/patient expects to be discharged to:: Private residence Living Arrangements: Spouse/significant other;Children Available Help at Discharge: Family;Available 24 hours/day Type of Home: House Home Access: Stairs to enter Entrance Stairs-Rails: Right;Left;Can reach both Entrance Stairs-Number of Steps: 5   Home Layout: One level Home Equipment: Shower seat - built in      Prior Function Prior Level of Function : Independent/Modified Independent  Extremity/Trunk Assessment   Upper Extremity Assessment Upper Extremity Assessment: Overall WFL for tasks assessed    Lower Extremity Assessment Lower Extremity  Assessment: Defer to PT evaluation    Cervical / Trunk Assessment Cervical / Trunk Assessment: Back Surgery  Communication   Communication Communication: No apparent difficulties    Cognition                                         Cueing       General Comments      Exercises     Assessment/Plan    PT Assessment    PT Problem List         PT Treatment Interventions      PT Goals (Current goals can be found in the Care Plan section)       Frequency       Co-evaluation               AM-PAC PT "6 Clicks" Mobility  Outcome Measure Help needed turning from your back to your side while in a flat bed without using bedrails?: None Help needed moving from lying on your back to sitting on the side of a flat bed without using bedrails?: None Help needed moving to and from a bed to a chair (including a wheelchair)?: None Help needed standing up from a chair using your arms (e.g., wheelchair or bedside chair)?: None Help needed to walk in hospital room?: A Little Help needed climbing 3-5 steps with a railing? : A Little 6 Click Score: 22    End of Session Equipment Utilized During Treatment: Gait belt Activity Tolerance: Patient tolerated treatment well Patient left: Other (comment) (Standing in room with wife present) Nurse Communication: Mobility status PT Visit Diagnosis: Unsteadiness on feet (R26.81);Pain Pain - Right/Left: Left Pain - part of body: Hip (and back)    Time: 6045-4098 PT Time Calculation (min) (ACUTE ONLY): 19 min   Charges:   PT Evaluation $PT Eval Low Complexity: 1 Low   PT General Charges $$ ACUTE PT VISIT: 1 Visit         Chad [name], PT, DPT Acute Rehabilitation Services Secure Chat Preferred Office: [phone]   Chad [name] 10/02/2023, 2:21 PM

## 2023-10-02 NOTE — Anesthesia Preprocedure Evaluation (Signed)
 Anesthesia Evaluation  Patient identified by MRN, date of birth, ID band Patient awake    Reviewed: Allergy & Precautions, NPO status , Patient's Chart, lab work & pertinent test results, reviewed documented beta blocker date and time   History of Anesthesia Complications Negative for: history of anesthetic complications  Airway Mallampati: II  TM Distance: >3 FB Neck ROM: Limited    Dental no notable dental hx.    Pulmonary sleep apnea and Continuous Positive Airway Pressure Ventilation , neg COPD   breath sounds clear to auscultation       Cardiovascular hypertension, (-) CAD, (-) Past MI, (-) Cardiac Stents and (-) CABG (-) dysrhythmias  Rhythm:Regular Rate:Normal     Neuro/Psych neg Seizures    GI/Hepatic ,GERD  ,,(+) neg Cirrhosis        Endo/Other  diabetes, Type 2    Renal/GU Renal disease     Musculoskeletal   Abdominal   Peds  Hematology   Anesthesia Other Findings   Reproductive/Obstetrics                              Anesthesia Physical Anesthesia Plan  ASA: 2  Anesthesia Plan: General   Post-op Pain Management:    Induction: Intravenous  PONV Risk Score and Plan: 2 and Ondansetron and Dexamethasone  Airway Management Planned: Oral ETT and Video Laryngoscope Planned  Additional Equipment:   Intra-op Plan:   Post-operative Plan: Extubation in OR  Informed Consent: I have reviewed the patients History and Physical, chart, labs and discussed the procedure including the risks, benefits and alternatives for the proposed anesthesia with the patient or authorized representative who has indicated his/her understanding and acceptance.     Dental advisory given  Plan Discussed with: CRNA  Anesthesia Plan Comments:          Anesthesia Quick Evaluation

## 2023-10-02 NOTE — Transfer of Care (Signed)
 Immediate Anesthesia Transfer of Care Note  Patient: Chad [name]  Procedure(s) Performed: LUMBAR LAMINECTOMY/DECOMPRESSION MICRODISCECTOMY ONE LEVEL (Left)  Patient Location: PACU  Anesthesia Type:General  Level of Consciousness: awake, drowsy, and patient cooperative  Airway & Oxygen Therapy: Patient Spontanous Breathing and Patient connected to nasal cannula oxygen  Post-op Assessment: Report given to RN and Post -op Vital signs reviewed and stable  Post vital signs: Reviewed and stable  Last Vitals:  Vitals Value Taken Time  BP 107/70 10/02/23 0953  Temp    Pulse 67 10/02/23 0955  Resp 13 10/02/23 0955  SpO2 99 % 10/02/23 0955  Vitals shown include unfiled device data.  Last Pain:  Vitals:   10/02/23 0629  TempSrc:   PainSc: 8       Patients Stated Pain Goal: 4 (10/02/23 0629)  Complications: No notable events documented.

## 2023-10-02 NOTE — Discharge Instructions (Signed)

## 2023-10-02 NOTE — Evaluation (Signed)
 Occupational Therapy Evaluation Patient Details Name: Chad [name] MRN: [id number] DOB: [birth year] Today's Date: 10/02/2023   History of Present Illness   63 yo M s/p Left hemi laminotomy, microdiscectomy L4-L5.  PMH includes: GERD, DM T2     Clinical Impressions Patient s/p procedure above.  He is essentially at his baseline for ADL completion and in room mobility.  Patient seen by PT for stairs and hallway mobility.  Patient with good understanding of all precautions, no further OT needs in the acute setting.  Recommend follow up as prescribed by MD.       If plan is discharge home, recommend the following:   Assist for transportation     Functional Status Assessment   Patient has not had a recent decline in their functional status     Equipment Recommendations         Recommendations for Other Services         Precautions/Restrictions   Precautions Precautions: Back Precaution Booklet Issued: Yes (comment) Precaution/Restrictions Comments: Reviewed from PT session Restrictions Weight Bearing Restrictions Per Provider Order: No     Mobility Bed Mobility Overal bed mobility: Modified Independent                  Transfers Overall transfer level: Modified independent Equipment used: None                      Balance Overall balance assessment: No apparent balance deficits (not formally assessed)                                         ADL either performed or assessed with clinical judgement   ADL Overall ADL's : Modified independent                                             Vision Patient Visual Report: No change from baseline       Perception Perception: Not tested       Praxis Praxis: Not tested       Pertinent Vitals/Pain Pain Assessment Pain Assessment: Faces Faces Pain Scale: Hurts a little bit Pain Location: L hip Pain Descriptors / Indicators: Tender Pain  Intervention(s): Monitored during session     Extremity/Trunk Assessment Upper Extremity Assessment Upper Extremity Assessment: Overall WFL for tasks assessed   Lower Extremity Assessment Lower Extremity Assessment: Defer to PT evaluation   Cervical / Trunk Assessment Cervical / Trunk Assessment: Back Surgery   Communication Communication Communication: No apparent difficulties   Cognition Arousal: Alert Behavior During Therapy: WFL for tasks assessed/performed Cognition: No apparent impairments                               Following commands: Intact       Cueing  General Comments   Cueing Techniques: Verbal cues      Exercises     Shoulder Instructions      Home Living Family/patient expects to be discharged to:: Private residence Living Arrangements: Spouse/significant other;Children Available Help at Discharge: Family;Available 24 hours/day Type of Home: House Home Access: Stairs to enter [employer] of Steps: 5 Entrance Stairs-Rails: Right;Left;Can reach both Home Layout: One level     Bathroom  Shower/Tub: Tub/shower unit;Walk-in Human resources officer: Standard     Home Equipment: Shower seat - built in          Prior Functioning/Environment Prior Level of Function : Independent/Modified Independent                    OT Problem List: Pain   OT Treatment/Interventions:        OT Goals(Current goals can be found in the care plan section)   Acute Rehab OT Goals Patient Stated Goal: Return home OT Goal Formulation: With patient Time For Goal Achievement: 10/06/23 Potential to Achieve Goals: Good   OT Frequency:       Co-evaluation              AM-PAC OT "6 Clicks" Daily Activity     Outcome Measure Help from another person eating meals?: None Help from another person taking care of personal grooming?: None Help from another person toileting, which includes using toliet, bedpan, or urinal?:  None Help from another person bathing (including washing, rinsing, drying)?: None Help from another person to put on and taking off regular upper body clothing?: None Help from another person to put on and taking off regular lower body clothing?: None 6 Click Score: 24   End of Session Nurse Communication: Mobility status  Activity Tolerance: Patient tolerated treatment well Patient left: in bed;with family/visitor present  OT Visit Diagnosis: Pain                Time: 1300-1320 OT Time Calculation (min): 20 min Charges:  OT General Charges $OT Visit: 1 Visit OT Evaluation $OT Eval Moderate Complexity: 1 Mod  10/02/2023  RP, OTR/L  Acute Rehabilitation Services  Office:  [phone]   Benjamen Brand 10/02/2023, 1:25 PM

## 2023-10-02 NOTE — Interval H&P Note (Signed)
 History and Physical Interval Note:  10/02/2023 7:28 AM  Chad [name]  has presented today for surgery, with the diagnosis of Lumbar radiculopathy.  The various methods of treatment have been discussed with the patient and family. After consideration of risks, benefits and other options for treatment, the patient has consented to  Procedure(s) with comments: LUMBAR LAMINECTOMY/DECOMPRESSION MICRODISCECTOMY 1 LEVEL (Left) - Left hemi laminotomy, microdiscectomy L4-L5 as a surgical intervention.  The patient's history has been reviewed, patient examined, no change in status, stable for surgery.  I have reviewed the patient's chart and labs.  Questions were answered to the patient's satisfaction.   4/5 EHL. TA 5/5  Loel Ring

## 2023-10-02 NOTE — Anesthesia Procedure Notes (Signed)
 Procedure Name: Intubation Date/Time: 10/02/2023 7:47 AM  Performed by: Grier Leber, CRNAPre-anesthesia Checklist: Patient identified, Emergency Drugs available, Suction available and Patient being monitored Patient Re-evaluated:Patient Re-evaluated prior to induction Oxygen Delivery Method: Circle System Utilized Preoxygenation: Pre-oxygenation with 100% oxygen Induction Type: IV induction Ventilation: Mask ventilation without difficulty Laryngoscope Size: Glidescope and 4 Grade View: Grade I Tube type: Oral Tube size: 7.5 mm Number of attempts: 2 Airway Equipment and Method: Stylet Placement Confirmation: ETT inserted through vocal cords under direct vision, positive ETCO2 and breath sounds checked- equal and bilateral Secured at: 22 cm Tube secured with: Tape Dental Injury: Teeth and Oropharynx as per pre-operative assessment  Comments: DL x1 with MAC 4--Grade 3 view. Glidescope in room and used--Grade 1 view. No issues or trauma. Easy mask ventilation throughout.

## 2023-10-03 ENCOUNTER — Encounter (HOSPITAL_COMMUNITY): Payer: Self-pay | Admitting: Specialist

## 2023-10-03 MED FILL — Thrombin For Soln Kit 20000 Unit: CUTANEOUS | Qty: 1 | Status: AC

## 2023-10-13 ENCOUNTER — Other Ambulatory Visit: Payer: Self-pay | Admitting: Family Medicine

## 2023-10-13 DIAGNOSIS — E119 Type 2 diabetes mellitus without complications: Secondary | ICD-10-CM

## 2023-10-21 ENCOUNTER — Other Ambulatory Visit: Payer: Self-pay | Admitting: Family Medicine

## 2023-10-21 DIAGNOSIS — E119 Type 2 diabetes mellitus without complications: Secondary | ICD-10-CM

## 2023-11-10 ENCOUNTER — Telehealth: Payer: Self-pay

## 2023-11-10 ENCOUNTER — Other Ambulatory Visit: Payer: Self-pay

## 2023-11-10 DIAGNOSIS — E119 Type 2 diabetes mellitus without complications: Secondary | ICD-10-CM

## 2023-11-10 MED ORDER — LISINOPRIL 20 MG PO TABS: 20.0000 mg | ORAL_TABLET | Freq: Every day | ORAL | 1 refills | Status: DC

## 2023-11-10 NOTE — Telephone Encounter (Signed)
 Requesting 90 day supply: Prescription Request  11/10/2023  LOV: 09/12/23  What is the name of the medication or equipment? lisinopril  (ZESTRIL ) 20 MG tablet [[id number]]   Have you contacted your pharmacy to request a refill? Yes   Which pharmacy would you like this sent to?  [id number] [address], [place] - [address] AT [facility] [address] [facility] [address] [place] [address] Phone: [phone] Fax: [phone]    Patient notified that their request is being sent to the clinical staff for review and that they should receive a response within 2 business days.   Please advise at [facility] [phone]

## 2023-11-10 NOTE — Telephone Encounter (Signed)
 Sent in

## 2023-12-16 ENCOUNTER — Other Ambulatory Visit: Payer: Self-pay | Admitting: Family Medicine

## 2023-12-21 ENCOUNTER — Other Ambulatory Visit: Payer: Self-pay | Admitting: Family Medicine

## 2024-01-23 ENCOUNTER — Encounter (INDEPENDENT_AMBULATORY_CARE_PROVIDER_SITE_OTHER): Payer: Self-pay | Admitting: *Deleted

## 2024-03-24 ENCOUNTER — Other Ambulatory Visit: Payer: Self-pay | Admitting: Family Medicine

## 2024-03-30 ENCOUNTER — Other Ambulatory Visit: Payer: Self-pay | Admitting: Family Medicine

## 2024-04-01 NOTE — Telephone Encounter (Signed)
 Rx 03/26/24 #90- duplicate request Requested Prescriptions  Pending Prescriptions Disp Refills   hydrochlorothiazide  (HYDRODIURIL ) 25 MG tablet [Pharmacy Med Name: HYDROCHLOROTHIAZIDE  25MG  TABLETS] 90 tablet 0    Sig: TAKE 1 TABLET(25 MG) BY MOUTH DAILY     Cardiovascular: Diuretics - Thiazide Failed - 04/01/2024  2:23 PM      Failed - Cr in normal range and within 180 days    Creat  Date Value Ref Range Status  09/12/2023 1.16 0.70 - 1.35 mg/dL Final   Creatinine, Urine  Date Value Ref Range Status  07/25/2022 180 20 - 320 mg/dL Final         Failed - K in normal range and within 180 days    Potassium  Date Value Ref Range Status  09/12/2023 4.4 3.5 - 5.3 mmol/L Final         Failed - Na in normal range and within 180 days    Sodium  Date Value Ref Range Status  09/12/2023 139 135 - 146 mmol/L Final         Failed - Last BP in normal range    BP Readings from Last 1 Encounters:  10/02/23 (!) 113/91         Failed - Valid encounter within last 6 months    Recent Outpatient Visits           6 months ago Benign essential HTN   [place] [facility] Duanne Butler DASEN, MD   1 year ago Diabetes mellitus type 2 in nonobese [facility])   [place] [facility] Duanne Butler DASEN, MD   2 years ago General medical exam   [place] [facility] Pick[place], Butler DASEN, MD

## 2024-04-21 ENCOUNTER — Other Ambulatory Visit: Payer: Self-pay | Admitting: Family Medicine

## 2024-04-21 DIAGNOSIS — E119 Type 2 diabetes mellitus without complications: Secondary | ICD-10-CM

## 2024-05-10 ENCOUNTER — Other Ambulatory Visit: Payer: Self-pay | Admitting: Family Medicine

## 2024-05-10 DIAGNOSIS — E119 Type 2 diabetes mellitus without complications: Secondary | ICD-10-CM
